# Patient Record
Sex: Female | Born: 1985 | Race: Black or African American | Hispanic: No | Marital: Single | Smoking: Never smoker
Health system: Southern US, Community
[De-identification: ages and names within clinical notes are randomized; demographics above are authoritative.]

## PROBLEM LIST (undated history)

## (undated) DIAGNOSIS — F419 Anxiety disorder, unspecified: Secondary | ICD-10-CM

## (undated) DIAGNOSIS — F329 Major depressive disorder, single episode, unspecified: Secondary | ICD-10-CM

## (undated) DIAGNOSIS — G43909 Migraine, unspecified, not intractable, without status migrainosus: Secondary | ICD-10-CM

## (undated) DIAGNOSIS — R102 Pelvic and perineal pain: Secondary | ICD-10-CM

## (undated) DIAGNOSIS — F32A Depression, unspecified: Secondary | ICD-10-CM

## (undated) DIAGNOSIS — K259 Gastric ulcer, unspecified as acute or chronic, without hemorrhage or perforation: Secondary | ICD-10-CM

## (undated) HISTORY — PX: CARDIAC ELECTROPHYSIOLOGY STUDY AND ABLATION: SHX1294

## (undated) HISTORY — PX: TONSILLECTOMY: SUR1361

---

## 2003-11-04 ENCOUNTER — Emergency Department: Payer: Self-pay | Admitting: Emergency Medicine

## 2003-12-06 ENCOUNTER — Inpatient Hospital Stay: Payer: Self-pay | Admitting: Internal Medicine

## 2004-02-04 ENCOUNTER — Emergency Department: Payer: Self-pay | Admitting: Emergency Medicine

## 2004-04-07 ENCOUNTER — Emergency Department: Payer: Self-pay | Admitting: Emergency Medicine

## 2007-01-25 HISTORY — PX: WISDOM TOOTH EXTRACTION: SHX21

## 2007-12-14 ENCOUNTER — Emergency Department: Payer: Self-pay | Admitting: Emergency Medicine

## 2008-05-11 ENCOUNTER — Emergency Department: Payer: Self-pay | Admitting: Emergency Medicine

## 2008-06-02 ENCOUNTER — Emergency Department: Payer: Self-pay | Admitting: Emergency Medicine

## 2008-09-28 ENCOUNTER — Emergency Department: Payer: Self-pay | Admitting: Emergency Medicine

## 2008-09-30 ENCOUNTER — Observation Stay: Payer: Self-pay | Admitting: Obstetrics & Gynecology

## 2008-11-24 ENCOUNTER — Observation Stay: Payer: Self-pay | Admitting: Obstetrics & Gynecology

## 2008-12-12 ENCOUNTER — Emergency Department: Payer: Self-pay | Admitting: Emergency Medicine

## 2008-12-30 ENCOUNTER — Inpatient Hospital Stay: Payer: Self-pay | Admitting: Obstetrics and Gynecology

## 2009-11-10 ENCOUNTER — Emergency Department: Payer: Self-pay | Admitting: Internal Medicine

## 2010-02-02 ENCOUNTER — Emergency Department: Payer: Self-pay | Admitting: Internal Medicine

## 2010-05-17 ENCOUNTER — Emergency Department: Payer: Self-pay | Admitting: Emergency Medicine

## 2010-07-02 ENCOUNTER — Observation Stay: Payer: Self-pay | Admitting: Internal Medicine

## 2010-11-17 ENCOUNTER — Emergency Department: Payer: Self-pay | Admitting: Emergency Medicine

## 2010-11-18 ENCOUNTER — Emergency Department: Payer: Self-pay | Admitting: Emergency Medicine

## 2011-01-12 ENCOUNTER — Emergency Department: Payer: Self-pay | Admitting: Internal Medicine

## 2011-01-22 ENCOUNTER — Ambulatory Visit: Payer: Self-pay | Admitting: Podiatry

## 2011-05-03 ENCOUNTER — Emergency Department: Payer: Self-pay | Admitting: *Deleted

## 2011-05-03 LAB — BASIC METABOLIC PANEL
Anion Gap: 9 (ref 7–16)
Calcium, Total: 8.6 mg/dL (ref 8.5–10.1)
Chloride: 107 mmol/L (ref 98–107)
EGFR (African American): >60
EGFR (Non-African Amer.): >60
Glucose: 71 mg/dL (ref 65–99)
Osmolality: 274 (ref 275–301)
Sodium: 139 mmol/L (ref 136–145)

## 2011-05-03 LAB — CBC
MCH: 27.8 pg (ref 26.0–34.0)
MCHC: 33.3 g/dL (ref 32.0–36.0)
Platelet: 222 10*3/uL (ref 150–440)
RBC: 4.25 10*6/uL (ref 3.80–5.20)
WBC: 7.8 10*3/uL (ref 3.6–11.0)

## 2011-05-03 LAB — URINALYSIS, COMPLETE
Blood: NEGATIVE
Ketone: NEGATIVE
Ph: 6 (ref 4.5–8.0)
Protein: NEGATIVE
RBC,UR: 2 /HPF (ref 0–5)
WBC UR: 2 /HPF (ref 0–5)

## 2011-05-03 LAB — PREGNANCY, URINE: Pregnancy Test, Urine: POSITIVE m[IU]/mL

## 2011-05-14 ENCOUNTER — Emergency Department: Payer: Self-pay | Admitting: Emergency Medicine

## 2011-05-14 LAB — HCG, QUANTITATIVE, PREGNANCY: Beta Hcg, Quant.: 142401 m[IU]/mL — ABNORMAL HIGH

## 2011-05-14 LAB — CBC
HGB: 10.9 g/dL — ABNORMAL LOW (ref 12.0–16.0)
MCV: 84 fL (ref 80–100)
Platelet: 244 10*3/uL (ref 150–440)
RDW: 13.6 % (ref 11.5–14.5)
WBC: 11.2 10*3/uL — ABNORMAL HIGH (ref 3.6–11.0)

## 2011-06-21 ENCOUNTER — Emergency Department: Payer: Self-pay | Admitting: Emergency Medicine

## 2011-06-21 LAB — URINALYSIS, COMPLETE
Glucose,UR: NEGATIVE mg/dL (ref 0–75)
Leukocyte Esterase: NEGATIVE
Nitrite: NEGATIVE
Ph: 7 (ref 4.5–8.0)
Protein: NEGATIVE
Specific Gravity: 1.021 (ref 1.003–1.030)
Squamous Epithelial: 10
WBC UR: 2 /HPF (ref 0–5)

## 2011-06-21 LAB — CBC
HCT: 32.8 % — ABNORMAL LOW (ref 35.0–47.0)
HGB: 10.8 g/dL — ABNORMAL LOW (ref 12.0–16.0)
MCH: 28 pg (ref 26.0–34.0)
MCHC: 33 g/dL (ref 32.0–36.0)
Platelet: 262 10*3/uL (ref 150–440)
RDW: 13.6 % (ref 11.5–14.5)

## 2011-06-21 LAB — BASIC METABOLIC PANEL
Calcium, Total: 8.7 mg/dL (ref 8.5–10.1)
Chloride: 105 mmol/L (ref 98–107)
Co2: 25 mmol/L (ref 21–32)
EGFR (Non-African Amer.): >60
Potassium: 3.6 mmol/L (ref 3.5–5.1)
Sodium: 138 mmol/L (ref 136–145)

## 2011-06-21 LAB — HEPATIC FUNCTION PANEL A (ARMC)
Albumin: 3.2 g/dL — ABNORMAL LOW (ref 3.4–5.0)
Total Protein: 7.3 g/dL (ref 6.4–8.2)

## 2011-07-23 ENCOUNTER — Emergency Department: Payer: Self-pay | Admitting: Internal Medicine

## 2011-07-23 LAB — URINALYSIS, COMPLETE
Bacteria: NONE SEEN
Blood: NEGATIVE
Nitrite: NEGATIVE
Protein: NEGATIVE
RBC,UR: <1 /HPF (ref 0–5)
Specific Gravity: 1.011 (ref 1.003–1.030)
Squamous Epithelial: 3
WBC UR: 1 /HPF (ref 0–5)

## 2011-09-20 ENCOUNTER — Observation Stay: Payer: Self-pay | Admitting: Obstetrics and Gynecology

## 2011-09-20 LAB — URINALYSIS, COMPLETE
Bilirubin,UR: NEGATIVE
Blood: NEGATIVE
Nitrite: NEGATIVE
Ph: 7 (ref 4.5–8.0)
Protein: NEGATIVE
Specific Gravity: 1.015 (ref 1.003–1.030)
WBC UR: 3 /HPF (ref 0–5)

## 2011-10-26 ENCOUNTER — Observation Stay: Payer: Self-pay

## 2011-10-26 LAB — URINALYSIS, COMPLETE
Bilirubin,UR: NEGATIVE
Glucose,UR: NEGATIVE mg/dL (ref 0–75)
Leukocyte Esterase: NEGATIVE
Nitrite: NEGATIVE
Protein: NEGATIVE
RBC,UR: NONE SEEN /HPF (ref 0–5)
Specific Gravity: 1.017 (ref 1.003–1.030)
WBC UR: 2 /HPF (ref 0–5)

## 2011-10-26 LAB — COMPREHENSIVE METABOLIC PANEL
Alkaline Phosphatase: 89 U/L (ref 50–136)
BUN: 4 mg/dL — ABNORMAL LOW (ref 7–18)
Bilirubin,Total: 0.5 mg/dL (ref 0.2–1.0)
Chloride: 105 mmol/L (ref 98–107)
Co2: 24 mmol/L (ref 21–32)
EGFR (Non-African Amer.): >60
Osmolality: 271 (ref 275–301)
SGOT(AST): 24 U/L (ref 15–37)

## 2011-10-26 LAB — CBC
HCT: 33 % — ABNORMAL LOW (ref 35.0–47.0)
HGB: 11.4 g/dL — ABNORMAL LOW (ref 12.0–16.0)
MCH: 29.1 pg (ref 26.0–34.0)
MCHC: 34.4 g/dL (ref 32.0–36.0)
MCV: 85 fL (ref 80–100)
RBC: 3.91 10*6/uL (ref 3.80–5.20)

## 2011-11-26 ENCOUNTER — Observation Stay: Payer: Self-pay | Admitting: Obstetrics & Gynecology

## 2012-02-14 ENCOUNTER — Emergency Department: Payer: Self-pay | Admitting: Emergency Medicine

## 2012-02-14 LAB — URINALYSIS, COMPLETE
Bacteria: NONE SEEN
Bilirubin,UR: NEGATIVE
Glucose,UR: NEGATIVE mg/dL (ref 0–75)
Ketone: NEGATIVE
Leukocyte Esterase: NEGATIVE
Nitrite: NEGATIVE
Ph: 6 (ref 4.5–8.0)
RBC,UR: 2 /HPF (ref 0–5)
Specific Gravity: 1.023 (ref 1.003–1.030)
WBC UR: 2 /HPF (ref 0–5)

## 2012-02-14 LAB — CBC
HGB: 12.5 g/dL (ref 12.0–16.0)
MCH: 27 pg (ref 26.0–34.0)
MCV: 84 fL (ref 80–100)
Platelet: 219 10*3/uL (ref 150–440)
RBC: 4.64 10*6/uL (ref 3.80–5.20)
RDW: 14.6 % — ABNORMAL HIGH (ref 11.5–14.5)

## 2012-02-14 LAB — BASIC METABOLIC PANEL
EGFR (African American): >60
Osmolality: 279 (ref 275–301)
Potassium: 3.9 mmol/L (ref 3.5–5.1)
Sodium: 140 mmol/L (ref 136–145)

## 2012-02-14 LAB — HEPATIC FUNCTION PANEL A (ARMC)
Bilirubin, Direct: 0.2 mg/dL (ref 0.00–0.20)
Bilirubin,Total: 0.8 mg/dL (ref 0.2–1.0)
SGOT(AST): 25 U/L (ref 15–37)

## 2012-05-01 ENCOUNTER — Emergency Department: Payer: Self-pay | Admitting: Emergency Medicine

## 2012-06-26 ENCOUNTER — Emergency Department: Payer: Self-pay | Admitting: Emergency Medicine

## 2012-06-26 LAB — COMPREHENSIVE METABOLIC PANEL
Albumin: 3.6 g/dL (ref 3.4–5.0)
Anion Gap: 3 — ABNORMAL LOW (ref 7–16)
BUN: 9 mg/dL (ref 7–18)
Co2: 29 mmol/L (ref 21–32)
Creatinine: 0.89 mg/dL (ref 0.60–1.30)
EGFR (African American): >60
EGFR (Non-African Amer.): >60
Glucose: 80 mg/dL (ref 65–99)
Osmolality: 275 (ref 275–301)
Sodium: 139 mmol/L (ref 136–145)

## 2012-06-26 LAB — LIPASE, BLOOD: Lipase: 148 U/L (ref 73–393)

## 2012-06-26 LAB — URINALYSIS, COMPLETE
Blood: NEGATIVE
Glucose,UR: NEGATIVE mg/dL (ref 0–75)
Ketone: NEGATIVE
Protein: NEGATIVE
Specific Gravity: 1.025 (ref 1.003–1.030)

## 2012-06-26 LAB — CBC
HCT: 37 % (ref 35.0–47.0)
HGB: 12.9 g/dL (ref 12.0–16.0)
MCH: 29.1 pg (ref 26.0–34.0)
MCHC: 35 g/dL (ref 32.0–36.0)
MCV: 83 fL (ref 80–100)
RBC: 4.45 10*6/uL (ref 3.80–5.20)
WBC: 6.7 10*3/uL (ref 3.6–11.0)

## 2012-06-26 LAB — GC/CHLAMYDIA PROBE AMP

## 2012-06-26 LAB — PREGNANCY, URINE: Pregnancy Test, Urine: NEGATIVE m[IU]/mL

## 2012-07-16 ENCOUNTER — Emergency Department: Payer: Self-pay | Admitting: Emergency Medicine

## 2012-07-16 LAB — COMPREHENSIVE METABOLIC PANEL
BUN: 9 mg/dL (ref 7–18)
Calcium, Total: 9 mg/dL (ref 8.5–10.1)
Chloride: 106 mmol/L (ref 98–107)
Co2: 29 mmol/L (ref 21–32)
EGFR (African American): >60
EGFR (Non-African Amer.): >60
Sodium: 140 mmol/L (ref 136–145)
Total Protein: 7.9 g/dL (ref 6.4–8.2)

## 2012-07-16 LAB — URINALYSIS, COMPLETE
Bilirubin,UR: NEGATIVE
Blood: NEGATIVE
Glucose,UR: NEGATIVE mg/dL (ref 0–75)
Leukocyte Esterase: NEGATIVE
Nitrite: NEGATIVE
Ph: 6 (ref 4.5–8.0)
Protein: NEGATIVE
RBC,UR: 2 /HPF (ref 0–5)
Squamous Epithelial: 3

## 2012-07-16 LAB — CBC
HGB: 13 g/dL (ref 12.0–16.0)
MCHC: 34.3 g/dL (ref 32.0–36.0)
RDW: 12.8 % (ref 11.5–14.5)

## 2012-08-22 ENCOUNTER — Emergency Department: Payer: Self-pay | Admitting: Emergency Medicine

## 2012-08-22 LAB — URINALYSIS, COMPLETE
Bilirubin,UR: NEGATIVE
Blood: NEGATIVE
Glucose,UR: NEGATIVE mg/dL (ref 0–75)
Ketone: NEGATIVE
Nitrite: NEGATIVE
Ph: 5 (ref 4.5–8.0)
RBC,UR: 2 /HPF (ref 0–5)
Specific Gravity: 1.025 (ref 1.003–1.030)
Squamous Epithelial: 8
WBC UR: 6 /HPF (ref 0–5)

## 2012-08-22 LAB — BASIC METABOLIC PANEL
Anion Gap: 3 — ABNORMAL LOW (ref 7–16)
BUN: 8 mg/dL (ref 7–18)
Chloride: 106 mmol/L (ref 98–107)
Co2: 29 mmol/L (ref 21–32)
EGFR (Non-African Amer.): >60
Glucose: 83 mg/dL (ref 65–99)
Osmolality: 273 (ref 275–301)
Potassium: 3.8 mmol/L (ref 3.5–5.1)
Sodium: 138 mmol/L (ref 136–145)

## 2012-08-22 LAB — CBC
HGB: 12.7 g/dL (ref 12.0–16.0)
MCH: 28.2 pg (ref 26.0–34.0)
RBC: 4.52 10*6/uL (ref 3.80–5.20)

## 2012-11-20 ENCOUNTER — Emergency Department: Payer: Self-pay | Admitting: Emergency Medicine

## 2012-11-20 LAB — CBC
HCT: 38.5 % (ref 35.0–47.0)
HGB: 13 g/dL (ref 12.0–16.0)
MCH: 28 pg (ref 26.0–34.0)
MCHC: 33.7 g/dL (ref 32.0–36.0)
Platelet: 248 10*3/uL (ref 150–440)
RBC: 4.64 10*6/uL (ref 3.80–5.20)
RDW: 13.6 % (ref 11.5–14.5)
WBC: 9.2 10*3/uL (ref 3.6–11.0)

## 2012-11-20 LAB — URINALYSIS, COMPLETE
Bilirubin,UR: NEGATIVE
Glucose,UR: NEGATIVE mg/dL (ref 0–75)
Ketone: NEGATIVE
Ph: 6 (ref 4.5–8.0)
Protein: NEGATIVE
RBC,UR: 2 /HPF (ref 0–5)
Squamous Epithelial: 3

## 2012-11-20 LAB — COMPREHENSIVE METABOLIC PANEL
Calcium, Total: 9 mg/dL (ref 8.5–10.1)
Chloride: 106 mmol/L (ref 98–107)
Creatinine: 0.86 mg/dL (ref 0.60–1.30)
EGFR (African American): >60
EGFR (Non-African Amer.): >60
Glucose: 104 mg/dL — ABNORMAL HIGH (ref 65–99)
Potassium: 3.5 mmol/L (ref 3.5–5.1)

## 2012-11-20 LAB — PREGNANCY, URINE: Pregnancy Test, Urine: NEGATIVE m[IU]/mL

## 2012-12-05 ENCOUNTER — Ambulatory Visit: Payer: Self-pay | Admitting: Unknown Physician Specialty

## 2012-12-13 ENCOUNTER — Emergency Department: Payer: Self-pay | Admitting: Emergency Medicine

## 2012-12-13 LAB — COMPREHENSIVE METABOLIC PANEL
Albumin: 3.6 g/dL (ref 3.4–5.0)
Alkaline Phosphatase: 55 U/L (ref 50–136)
BUN: 11 mg/dL (ref 7–18)
Calcium, Total: 8.8 mg/dL (ref 8.5–10.1)
Chloride: 107 mmol/L (ref 98–107)
Co2: 29 mmol/L (ref 21–32)
Creatinine: 0.96 mg/dL (ref 0.60–1.30)
EGFR (African American): >60
Glucose: 99 mg/dL (ref 65–99)
Potassium: 4.3 mmol/L (ref 3.5–5.1)

## 2012-12-13 LAB — URINALYSIS, COMPLETE
Bacteria: NONE SEEN
Bilirubin,UR: NEGATIVE
Blood: NEGATIVE
Glucose,UR: NEGATIVE mg/dL (ref 0–75)
Ketone: NEGATIVE
Leukocyte Esterase: NEGATIVE
Nitrite: NEGATIVE
Protein: NEGATIVE
RBC,UR: 1 /HPF (ref 0–5)
Squamous Epithelial: 2
WBC UR: <1 /HPF (ref 0–5)

## 2012-12-13 LAB — CBC WITH DIFFERENTIAL/PLATELET
Eosinophil #: 0.1 10*3/uL (ref 0.0–0.7)
Eosinophil %: 1.1 %
HCT: 35.8 % (ref 35.0–47.0)
MCH: 28 pg (ref 26.0–34.0)
MCHC: 33.7 g/dL (ref 32.0–36.0)
MCV: 83 fL (ref 80–100)
Monocyte #: 0.6 x10 3/mm (ref 0.2–0.9)
Monocyte %: 10.3 %
Neutrophil #: 2.9 10*3/uL (ref 1.4–6.5)
Neutrophil %: 48.5 %
Platelet: 211 10*3/uL (ref 150–440)

## 2012-12-28 ENCOUNTER — Ambulatory Visit: Payer: Self-pay | Admitting: Unknown Physician Specialty

## 2013-01-07 ENCOUNTER — Ambulatory Visit: Payer: Self-pay | Admitting: Unknown Physician Specialty

## 2013-01-10 ENCOUNTER — Emergency Department: Payer: Self-pay | Admitting: Emergency Medicine

## 2013-01-10 LAB — URINALYSIS, COMPLETE
Bilirubin,UR: NEGATIVE
Blood: NEGATIVE
Glucose,UR: NEGATIVE mg/dL (ref 0–75)
Ketone: NEGATIVE
Nitrite: NEGATIVE
Ph: 6 (ref 4.5–8.0)
Specific Gravity: 1.013 (ref 1.003–1.030)

## 2013-01-10 LAB — CBC WITH DIFFERENTIAL/PLATELET
Basophil #: 0 10*3/uL (ref 0.0–0.1)
Eosinophil %: 1 %
HCT: 37.7 % (ref 35.0–47.0)
Lymphocyte #: 1.7 10*3/uL (ref 1.0–3.6)
Lymphocyte %: 26.3 %
MCH: 27.9 pg (ref 26.0–34.0)
MCHC: 33.4 g/dL (ref 32.0–36.0)
MCV: 84 fL (ref 80–100)
Monocyte #: 0.6 x10 3/mm (ref 0.2–0.9)
Monocyte %: 8.9 %
Neutrophil #: 4.1 10*3/uL (ref 1.4–6.5)
Platelet: 215 10*3/uL (ref 150–440)
RBC: 4.51 10*6/uL (ref 3.80–5.20)
WBC: 6.5 10*3/uL (ref 3.6–11.0)

## 2013-01-10 LAB — BASIC METABOLIC PANEL
BUN: 10 mg/dL (ref 7–18)
Calcium, Total: 8.9 mg/dL (ref 8.5–10.1)
Creatinine: 0.84 mg/dL (ref 0.60–1.30)
EGFR (African American): >60
EGFR (Non-African Amer.): >60
Glucose: 87 mg/dL (ref 65–99)
Potassium: 3.7 mmol/L (ref 3.5–5.1)

## 2013-01-10 LAB — PREGNANCY, URINE: Pregnancy Test, Urine: NEGATIVE m[IU]/mL

## 2013-02-25 ENCOUNTER — Emergency Department: Payer: Self-pay | Admitting: Emergency Medicine

## 2013-02-25 LAB — CBC WITH DIFFERENTIAL/PLATELET
BASOS ABS: 0 10*3/uL (ref 0.0–0.1)
Basophil %: 0.3 %
EOS PCT: 1.2 %
Eosinophil #: 0.1 10*3/uL (ref 0.0–0.7)
HCT: 36.6 % (ref 35.0–47.0)
HGB: 12.3 g/dL (ref 12.0–16.0)
LYMPHS PCT: 36.1 %
Lymphocyte #: 2 10*3/uL (ref 1.0–3.6)
MCH: 28.1 pg (ref 26.0–34.0)
MCHC: 33.6 g/dL (ref 32.0–36.0)
MCV: 84 fL (ref 80–100)
MONO ABS: 0.5 x10 3/mm (ref 0.2–0.9)
Monocyte %: 9 %
Neutrophil #: 3 10*3/uL (ref 1.4–6.5)
Neutrophil %: 53.4 %
Platelet: 210 10*3/uL (ref 150–440)
RBC: 4.36 10*6/uL (ref 3.80–5.20)
RDW: 12.9 % (ref 11.5–14.5)
WBC: 5.6 10*3/uL (ref 3.6–11.0)

## 2013-02-25 LAB — COMPREHENSIVE METABOLIC PANEL
ALK PHOS: 57 U/L
ANION GAP: 4 — AB (ref 7–16)
AST: 21 U/L (ref 15–37)
Albumin: 3.4 g/dL (ref 3.4–5.0)
BILIRUBIN TOTAL: 0.6 mg/dL (ref 0.2–1.0)
BUN: 11 mg/dL (ref 7–18)
CO2: 26 mmol/L (ref 21–32)
Calcium, Total: 8.9 mg/dL (ref 8.5–10.1)
Chloride: 106 mmol/L (ref 98–107)
Creatinine: 0.89 mg/dL (ref 0.60–1.30)
EGFR (African American): >60
Glucose: 86 mg/dL (ref 65–99)
OSMOLALITY: 271 (ref 275–301)
Potassium: 3.5 mmol/L (ref 3.5–5.1)
SGPT (ALT): 15 U/L (ref 12–78)
Sodium: 136 mmol/L (ref 136–145)
Total Protein: 7.2 g/dL (ref 6.4–8.2)

## 2013-02-25 LAB — URINALYSIS, COMPLETE
BACTERIA: NONE SEEN
BLOOD: NEGATIVE
Bilirubin,UR: NEGATIVE
Glucose,UR: NEGATIVE mg/dL (ref 0–75)
Ketone: NEGATIVE
LEUKOCYTE ESTERASE: NEGATIVE
NITRITE: NEGATIVE
PH: 6 (ref 4.5–8.0)
Protein: NEGATIVE
RBC,UR: 1 /HPF (ref 0–5)
Specific Gravity: 1.023 (ref 1.003–1.030)

## 2013-02-25 LAB — LIPASE, BLOOD: Lipase: 153 U/L (ref 73–393)

## 2013-03-29 ENCOUNTER — Emergency Department: Payer: Self-pay | Admitting: Emergency Medicine

## 2013-03-29 LAB — URINALYSIS, COMPLETE
BLOOD: NEGATIVE
Bilirubin,UR: NEGATIVE
Glucose,UR: NEGATIVE mg/dL (ref 0–75)
KETONE: NEGATIVE
Leukocyte Esterase: NEGATIVE
NITRITE: NEGATIVE
PH: 6 (ref 4.5–8.0)
PROTEIN: NEGATIVE
RBC,UR: 1 /HPF (ref 0–5)
SPECIFIC GRAVITY: 1.019 (ref 1.003–1.030)
Squamous Epithelial: 3
Transitional Epi: <1
WBC UR: <1 /HPF (ref 0–5)

## 2013-03-29 LAB — COMPREHENSIVE METABOLIC PANEL
ALBUMIN: 3.5 g/dL (ref 3.4–5.0)
ALK PHOS: 54 U/L
Anion Gap: 5 — ABNORMAL LOW (ref 7–16)
BUN: 5 mg/dL — ABNORMAL LOW (ref 7–18)
Bilirubin,Total: 0.5 mg/dL (ref 0.2–1.0)
CHLORIDE: 104 mmol/L (ref 98–107)
Calcium, Total: 8.3 mg/dL — ABNORMAL LOW (ref 8.5–10.1)
Co2: 26 mmol/L (ref 21–32)
Creatinine: 0.76 mg/dL (ref 0.60–1.30)
EGFR (African American): >60
EGFR (Non-African Amer.): >60
Glucose: 81 mg/dL (ref 65–99)
Osmolality: 266 (ref 275–301)
Potassium: 3.4 mmol/L — ABNORMAL LOW (ref 3.5–5.1)
SGOT(AST): 16 U/L (ref 15–37)
SGPT (ALT): 12 U/L (ref 12–78)
Sodium: 135 mmol/L — ABNORMAL LOW (ref 136–145)
Total Protein: 7.5 g/dL (ref 6.4–8.2)

## 2013-03-29 LAB — TROPONIN I: Troponin-I: <0.02 ng/mL

## 2013-03-29 LAB — CBC
HCT: 36.5 % (ref 35.0–47.0)
HGB: 12.3 g/dL (ref 12.0–16.0)
MCH: 28.5 pg (ref 26.0–34.0)
MCHC: 33.8 g/dL (ref 32.0–36.0)
MCV: 84 fL (ref 80–100)
PLATELETS: 240 10*3/uL (ref 150–440)
RBC: 4.33 10*6/uL (ref 3.80–5.20)
RDW: 13.7 % (ref 11.5–14.5)
WBC: 9.3 10*3/uL (ref 3.6–11.0)

## 2013-05-20 ENCOUNTER — Emergency Department: Payer: Self-pay | Admitting: Emergency Medicine

## 2013-05-20 LAB — URINALYSIS, COMPLETE
BACTERIA: NONE SEEN
BILIRUBIN, UR: NEGATIVE
BLOOD: NEGATIVE
Glucose,UR: NEGATIVE mg/dL (ref 0–75)
LEUKOCYTE ESTERASE: NEGATIVE
Nitrite: NEGATIVE
Ph: 6 (ref 4.5–8.0)
Protein: NEGATIVE
RBC,UR: NONE SEEN /HPF (ref 0–5)
Specific Gravity: 1.021 (ref 1.003–1.030)
WBC UR: NONE SEEN /HPF (ref 0–5)

## 2013-05-20 LAB — COMPREHENSIVE METABOLIC PANEL
ALBUMIN: 3.2 g/dL — AB (ref 3.4–5.0)
ALK PHOS: 54 U/L
ALT: 15 U/L (ref 12–78)
ANION GAP: 9 (ref 7–16)
AST: 15 U/L (ref 15–37)
BUN: 5 mg/dL — AB (ref 7–18)
Bilirubin,Total: 0.3 mg/dL (ref 0.2–1.0)
CALCIUM: 8.8 mg/dL (ref 8.5–10.1)
CO2: 23 mmol/L (ref 21–32)
Chloride: 104 mmol/L (ref 98–107)
Creatinine: 0.72 mg/dL (ref 0.60–1.30)
GLUCOSE: 91 mg/dL (ref 65–99)
OSMOLALITY: 269 (ref 275–301)
POTASSIUM: 3.3 mmol/L — AB (ref 3.5–5.1)
Sodium: 136 mmol/L (ref 136–145)
TOTAL PROTEIN: 7.4 g/dL (ref 6.4–8.2)

## 2013-05-20 LAB — CBC WITH DIFFERENTIAL/PLATELET
BASOS ABS: 0 10*3/uL (ref 0.0–0.1)
BASOS PCT: 0.1 %
Eosinophil #: 0.1 10*3/uL (ref 0.0–0.7)
Eosinophil %: 1.3 %
HCT: 33.9 % — AB (ref 35.0–47.0)
HGB: 11.3 g/dL — ABNORMAL LOW (ref 12.0–16.0)
Lymphocyte #: 2.1 10*3/uL (ref 1.0–3.6)
Lymphocyte %: 26 %
MCH: 28.3 pg (ref 26.0–34.0)
MCHC: 33.3 g/dL (ref 32.0–36.0)
MCV: 85 fL (ref 80–100)
MONO ABS: 0.5 x10 3/mm (ref 0.2–0.9)
MONOS PCT: 6.1 %
Neutrophil #: 5.3 10*3/uL (ref 1.4–6.5)
Neutrophil %: 66.5 %
Platelet: 209 10*3/uL (ref 150–440)
RBC: 3.99 10*6/uL (ref 3.80–5.20)
RDW: 14.3 % (ref 11.5–14.5)
WBC: 8 10*3/uL (ref 3.6–11.0)

## 2013-05-20 LAB — HCG, QUANTITATIVE, PREGNANCY: Beta Hcg, Quant.: 7131 m[IU]/mL — ABNORMAL HIGH

## 2013-05-22 ENCOUNTER — Emergency Department: Payer: Self-pay | Admitting: Emergency Medicine

## 2013-05-22 LAB — COMPREHENSIVE METABOLIC PANEL
ALK PHOS: 48 U/L
Albumin: 3.1 g/dL — ABNORMAL LOW (ref 3.4–5.0)
Anion Gap: 8 (ref 7–16)
BUN: 6 mg/dL — ABNORMAL LOW (ref 7–18)
Bilirubin,Total: 0.3 mg/dL (ref 0.2–1.0)
CHLORIDE: 104 mmol/L (ref 98–107)
CO2: 26 mmol/L (ref 21–32)
CREATININE: 0.63 mg/dL (ref 0.60–1.30)
Calcium, Total: 9.3 mg/dL (ref 8.5–10.1)
EGFR (African American): >60
Glucose: 85 mg/dL (ref 65–99)
Osmolality: 273 (ref 275–301)
POTASSIUM: 3.8 mmol/L (ref 3.5–5.1)
SGOT(AST): 15 U/L (ref 15–37)
SGPT (ALT): 17 U/L (ref 12–78)
Sodium: 138 mmol/L (ref 136–145)
Total Protein: 7.3 g/dL (ref 6.4–8.2)

## 2013-05-22 LAB — URINALYSIS, COMPLETE
BACTERIA: NONE SEEN
BILIRUBIN, UR: NEGATIVE
BLOOD: NEGATIVE
Glucose,UR: NEGATIVE mg/dL (ref 0–75)
Ketone: NEGATIVE
Leukocyte Esterase: NEGATIVE
Nitrite: NEGATIVE
PH: 7 (ref 4.5–8.0)
Protein: NEGATIVE
RBC,UR: NONE SEEN /HPF (ref 0–5)
SQUAMOUS EPITHELIAL: NONE SEEN
Specific Gravity: 1.018 (ref 1.003–1.030)
WBC UR: NONE SEEN /HPF (ref 0–5)

## 2013-05-22 LAB — CBC
HCT: 34.2 % — AB (ref 35.0–47.0)
HGB: 11.1 g/dL — AB (ref 12.0–16.0)
MCH: 27.5 pg (ref 26.0–34.0)
MCHC: 32.4 g/dL (ref 32.0–36.0)
MCV: 85 fL (ref 80–100)
Platelet: 227 10*3/uL (ref 150–440)
RBC: 4.04 10*6/uL (ref 3.80–5.20)
RDW: 14.1 % (ref 11.5–14.5)
WBC: 9.2 10*3/uL (ref 3.6–11.0)

## 2013-05-22 LAB — MAGNESIUM: MAGNESIUM: 1.9 mg/dL

## 2013-05-22 LAB — HCG, QUANTITATIVE, PREGNANCY: Beta Hcg, Quant.: 6088 m[IU]/mL — ABNORMAL HIGH

## 2013-07-08 ENCOUNTER — Observation Stay: Payer: Self-pay

## 2013-07-08 LAB — URINALYSIS, COMPLETE
BILIRUBIN, UR: NEGATIVE
Bacteria: NONE SEEN
Blood: NEGATIVE
Glucose,UR: NEGATIVE mg/dL (ref 0–75)
Ketone: NEGATIVE
Leukocyte Esterase: NEGATIVE
NITRITE: NEGATIVE
Ph: 7 (ref 4.5–8.0)
Protein: NEGATIVE
RBC,UR: 1 /HPF (ref 0–5)
Specific Gravity: 1.02 (ref 1.003–1.030)
WBC UR: NONE SEEN /HPF (ref 0–5)

## 2013-08-07 ENCOUNTER — Emergency Department: Payer: Self-pay | Admitting: Emergency Medicine

## 2013-08-07 LAB — CBC
HCT: 32.7 % — AB (ref 35.0–47.0)
HGB: 10.7 g/dL — AB (ref 12.0–16.0)
MCH: 28.1 pg (ref 26.0–34.0)
MCHC: 32.8 g/dL (ref 32.0–36.0)
MCV: 86 fL (ref 80–100)
Platelet: 197 10*3/uL (ref 150–440)
RBC: 3.83 10*6/uL (ref 3.80–5.20)
RDW: 14.3 % (ref 11.5–14.5)
WBC: 8.2 10*3/uL (ref 3.6–11.0)

## 2013-08-07 LAB — COMPREHENSIVE METABOLIC PANEL
ALK PHOS: 54 U/L
AST: 21 U/L (ref 15–37)
Albumin: 2.7 g/dL — ABNORMAL LOW (ref 3.4–5.0)
Anion Gap: 7 (ref 7–16)
BUN: 5 mg/dL — AB (ref 7–18)
Bilirubin,Total: 0.3 mg/dL (ref 0.2–1.0)
CHLORIDE: 108 mmol/L — AB (ref 98–107)
Calcium, Total: 8.3 mg/dL — ABNORMAL LOW (ref 8.5–10.1)
Co2: 24 mmol/L (ref 21–32)
Creatinine: 0.69 mg/dL (ref 0.60–1.30)
EGFR (Non-African Amer.): >60
Glucose: 82 mg/dL (ref 65–99)
OSMOLALITY: 274 (ref 275–301)
Potassium: 3.4 mmol/L — ABNORMAL LOW (ref 3.5–5.1)
SGPT (ALT): 16 U/L (ref 12–78)
Sodium: 139 mmol/L (ref 136–145)
Total Protein: 6.8 g/dL (ref 6.4–8.2)

## 2013-08-07 LAB — LIPASE, BLOOD: LIPASE: 150 U/L (ref 73–393)

## 2013-09-17 ENCOUNTER — Observation Stay: Payer: Self-pay | Admitting: Obstetrics and Gynecology

## 2013-09-17 LAB — FETAL FIBRONECTIN
Appearance: NORMAL
Fetal Fibronectin: NEGATIVE

## 2013-09-17 LAB — URINALYSIS, COMPLETE
Bilirubin,UR: NEGATIVE
Blood: NEGATIVE
Glucose,UR: NEGATIVE mg/dL (ref 0–75)
Ketone: NEGATIVE
Leukocyte Esterase: NEGATIVE
Nitrite: NEGATIVE
PH: 7 (ref 4.5–8.0)
Protein: NEGATIVE
RBC,UR: 1 /HPF (ref 0–5)
SPECIFIC GRAVITY: 1.018 (ref 1.003–1.030)
Squamous Epithelial: 5
WBC UR: 1 /HPF (ref 0–5)

## 2013-10-03 ENCOUNTER — Observation Stay: Payer: Self-pay | Admitting: Obstetrics & Gynecology

## 2013-10-03 LAB — URINALYSIS, COMPLETE
Bilirubin,UR: NEGATIVE
Blood: NEGATIVE
Glucose,UR: NEGATIVE mg/dL (ref 0–75)
KETONE: NEGATIVE
Leukocyte Esterase: NEGATIVE
NITRITE: NEGATIVE
Ph: 7 (ref 4.5–8.0)
Protein: NEGATIVE
Specific Gravity: 1.026 (ref 1.003–1.030)
Squamous Epithelial: 2
WBC UR: 1 /HPF (ref 0–5)

## 2014-05-06 ENCOUNTER — Emergency Department: Admit: 2014-05-06 | Disposition: A | Discharge status: Home / Self Care | Payer: Self-pay | Admitting: Student

## 2014-05-06 LAB — URINALYSIS, COMPLETE
BLOOD: NEGATIVE
Bacteria: NONE SEEN
Bilirubin,UR: NEGATIVE
Glucose,UR: NEGATIVE mg/dL (ref 0–75)
Ketone: NEGATIVE
Leukocyte Esterase: NEGATIVE
Nitrite: NEGATIVE
PROTEIN: NEGATIVE
Ph: 5 (ref 4.5–8.0)
SPECIFIC GRAVITY: 1.019 (ref 1.003–1.030)

## 2014-05-06 LAB — CBC
HCT: 38.5 % (ref 35.0–47.0)
HGB: 12.5 g/dL (ref 12.0–16.0)
MCH: 27.7 pg (ref 26.0–34.0)
MCHC: 32.5 g/dL (ref 32.0–36.0)
MCV: 85 fL (ref 80–100)
PLATELETS: 229 10*3/uL (ref 150–440)
RBC: 4.51 10*6/uL (ref 3.80–5.20)
RDW: 13 % (ref 11.5–14.5)
WBC: 5.7 10*3/uL (ref 3.6–11.0)

## 2014-05-06 LAB — COMPREHENSIVE METABOLIC PANEL
ALBUMIN: 3.9 g/dL
ALT: 11 U/L — AB
Alkaline Phosphatase: 49 U/L
Anion Gap: 4 — ABNORMAL LOW (ref 7–16)
BUN: 12 mg/dL
Bilirubin,Total: 0.5 mg/dL
CALCIUM: 8.7 mg/dL — AB
CO2: 27 mmol/L
CREATININE: 0.9 mg/dL
Chloride: 108 mmol/L
EGFR (Non-African Amer.): >60
GLUCOSE: 87 mg/dL
POTASSIUM: 3.6 mmol/L
SGOT(AST): 18 U/L
SODIUM: 139 mmol/L
TOTAL PROTEIN: 7.3 g/dL

## 2014-05-06 LAB — PREGNANCY, URINE: PREGNANCY TEST, URINE: NEGATIVE m[IU]/mL

## 2014-05-06 LAB — LIPASE, BLOOD: Lipase: 36 U/L

## 2014-06-03 NOTE — H&P (Signed)
L&D Evaluation:  History Expanded:  HPI 29 yo G5P3013 presents to L&D at approx 32 weeks 5 days from ER for lower abdominal pain and oain in her pelvis. SHe had a fall a few weeks ago, but has not felt good since then with the pain getting worse with standing and when she gets out of bed.Her BP today is 89/51 and she does not seem to be in distress, SHe is having trouble sleeping due to the pain.  She denies VB, she reports good FM. Does feel occas ctx, but pt describes them as cramping. She saw [facility] MW her in L and D back in June 2015 but no H and P done so no record of what complaints or plan was. [facility]. Pt has had 3 term deliveries in the past. She states she has not been able to get out of bed or get to work for two days due to the pain she is having. It is in her lower pelvis. Dizziness and anemia was a problem from the beginning of her pregnancy and she was told to take iron along with her PNV.   Gravida 5   Term 3   PreTerm 0   Abortion 1   Living 3   Group B Strep Results Maternal (Result >5wks must be treated as unknown) negative   Maternal HIV Positive   EDC 08-Nov-2013   Presents with abdominal pain, back pain   Patient's Medical History No Chronic Illness   Patient's Surgical History none   Medications Pre Natal Vitamins  Iron   Allergies PCN, amoxicillin, cephalosporins   Social History none   Family History Non-Contributory   ROS:  ROS see HPI   Exam:  Vital Signs stable  89/51   Urine Protein not completed   General no apparent distress   Mental Status clear   Chest clear   Heart normal sinus rhythm   Abdomen gravid, non-tender   Estimated Fetal Weight Average for gestational age   Back no CVAT   Edema no edema   Pelvic no external lesions, ft/thick/high   Mebranes Intact   FHT normal rate with no decels, very active fetus,Reassuring. unable to  trace due to belly size   Ucx absent   Skin  dry   Impression:  Impression reactive NST, 32w 5 days   Plan:  Plan EFM/NST, monitor contractions and for cervical change, discharge   Comments UA done, FFN done get belly binder and give flexeril  discharge to home and [facility]. discussed exercises for her to do to strengthen back [facility] to give her PT consult and to follow up with BP. Pt to use benadryl to sleep if needed prn only and also to use sparingly use tylenol sparingly for back oaon no more thatn 2000 mg a day   Follow Up Appointment already scheduled   Electronic Signatures: Adria DevonKlett, Katoya Amato (MD)  (Signed 25-Aug-15 15:23)  Authored: L&D Evaluation   Last Updated: 25-Aug-15 15:23 by Adria DevonKlett, Tawfiq Favila (MD)

## 2014-06-03 NOTE — H&P (Signed)
L&D Evaluation:  History Expanded:  HPI 29 yo [id number] presents to L&D at approx 35weeks for lower abdominal pain. LOF as well x 24 hours, scant and intermittant.  She denies VB, she reports good FM. Does feel occas ctx, but pt describes them as cramping.  [facility] so [facility]. Pt has had 3 term deliveries in the past.  Dizziness and anemia was a problem from the beginning of her pregnancy and she was told to take iron along with her PNV.   Gravida 5   Term 3   PreTerm 0   Abortion 1   Living 3   Group B Strep Results Maternal (Result >5wks must be treated as unknown) negative   Maternal HIV Positive   EDC 08-Nov-2013   Presents with abdominal pain, back pain   Patient's Medical History No Chronic Illness   Patient's Surgical History none   Medications Pre Natal Vitamins  Iron   Allergies PCN, amoxicillin, cephalosporins   Social History none   Family History Non-Contributory   ROS:  ROS see HPI   Exam:  Vital Signs stable   Urine Protein not completed   General no apparent distress   Mental Status clear   Abdomen gravid, non-tender   Estimated Fetal Weight Average for gestational age   Back no CVAT   Edema no edema   Pelvic no external lesions, cervix closed and thick   Mebranes Intact, Neg Nitrazine and pool   FHT normal rate with no decels, very active fetus,Reassuring   Ucx absent   Skin dry   Impression:  Impression reactive NST, 35 weeks.  Abd pain.   Plan:  Plan EFM/NST, monitor contractions and for cervical change, discharge   Comments UA done, neg discharge to home and [facility].  fluids and rest   Follow Up Appointment already scheduled   Electronic Signatures: Letitia LibraHarris, Robert Paul (MD)  (Signed 10-Sep-15 20:42)  Authored: L&D Evaluation   Last Updated: 10-Sep-15 20:42 by Letitia LibraHarris, Robert Paul (MD)

## 2014-06-03 NOTE — H&P (Signed)
L&D Evaluation:  History:   HPI 29 yo G4P2012 at approximately [redacted] weeks gestation presenting with mutliple complaints.    The patient states she was at work started feeling dizzy and seeing lines in her vision.  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  She was sitting at the onset of symptoms.  Denies similar symptoms previously in the pregnancy.  She does feel like her heart was razing at the time of the dizzyness.  Resolved with laying down.  She also reports abdominal pain for the last three weeks.  She has a sharp intermitten pain sides of pelvis radiating into groin, this pain is positional and worse when standing.  Seperate with this she also reports epigastric pain brought on after eating.  She does report morning cough, occasional sour taste in the back of her throat.  Has not taken any OTC antacids although she does report being diagnosed with indigestion earlier this pregnancy.  No constipation, no diarrhea, occassional nausea  Reports +FM, no LOF, no VB.  [facility].  PNL A+, ABSC neg, RI, HIV neg, HBsAg neg, GC/CT neg/neg    Presents with abdominal pain, dizzyness    Patient's Medical History anxiety    Patient's Surgical History none    Medications Pre Natal Vitamins    Allergies PCN, amoxicillin, cephalosporin   ROS:   ROS see above otherwise negative   Exam:   Vital Signs stable    General no apparent distress    Mental Status clear    Heart normal sinus rhythm    Abdomen gravid, non-tender    Estimated Fetal Weight Average for gestational age    Back no CVAT    Edema no edema    Reflexes 1+    FHT normal rate with no decels, reactive NST    Ucx absent   Impression:   Impression Discomforts of pregnancy   Plan:   Plan EFM/NST    Comments 1) Dizzyness - discussed vasovagal syncope/caval compression during pregnancy.    2) Abdominal pain - seems  secondary to round ligament pain based on description as well as GERD symptoms      - Rx famotidine 20mg  tab po bid  3) Disposition - discharge home with [facility]    Follow Up Appointment already scheduled   Electronic Signatures: Lorrene ReidStaebler, Karron Goens M (MD)  (Signed 27-Aug-13 16:21)  Authored: L&D Evaluation   Last Updated: 27-Aug-13 16:21 by Lorrene ReidStaebler, Addy Mcmannis M (MD)

## 2014-06-03 NOTE — H&P (Signed)
L&D Evaluation:  History:   HPI 29 yo [id number] presents to L&D at approx 33 weeks from ER after a fall she had at home. She was checked out in the ER and cleared. Pt c/o some leaking today, especially after fall. Pt states she was going up steps and got a little dizzy and fell down on her left side. She denies VB, she reports good FM. Does feel occas ctx, but pt describes them as cramping. States she was in ER last week to get hydrated because she couldn't keep anything down.  [facility]. Pt has had 2 term deliveries in the past. Dizziness and anemia was a problem from the beginning of her pregnancy and she was told to take iron along with her PNV.    Presents with leaking fluid, fall    Patient's Medical History No Chronic Illness    Patient's Surgical History none    Medications Pre Natal Vitamins  Iron    Allergies PCN, amoxicillin, cephalosporins    Social History none    Family History Non-Contributory   ROS:   ROS see HPI   Exam:   Vital Signs stable    Urine Protein not completed    General no apparent distress    Mental Status clear    Chest clear    Abdomen gravid, non-tender    Estimated Fetal Weight Average for gestational age    Back no CVAT    Edema no edema    Pelvic no external lesions, ft/thick/high    Mebranes Intact, nitrazine neg, no fern, no pooling    FHT normal rate with no decels    Ucx irritability    Skin dry   Impression:   Impression reactive NST, 31 weeks, fall   Plan:   Plan EFM/NST, monitor contractions and for cervical change, discharge, fern neg, nitrazine neg, wet prep neg    Comments UA done in ER neg except for ketones CBC and CMP WNL, EKG in ER normal discharge to home and [facility].   Electronic Signatures: Shella Maximutnam, Sheily Lineman (CNM)  (Signed 02-Oct-13 21:27)  Authored: L&D Evaluation   Last Updated: 02-Oct-13 21:27 by Shella MaximPutnam, Sharday Michl (CNM)

## 2014-07-20 ENCOUNTER — Emergency Department: Payer: 59

## 2014-07-20 ENCOUNTER — Emergency Department
Admission: EM | Admit: 2014-07-20 | Discharge: 2014-07-20 | Disposition: A | Discharge status: Home / Self Care | Payer: 59 | Attending: Emergency Medicine | Admitting: Emergency Medicine

## 2014-07-20 ENCOUNTER — Encounter: Payer: Self-pay | Admitting: Medical Oncology

## 2014-07-20 ENCOUNTER — Other Ambulatory Visit: Payer: Self-pay

## 2014-07-20 DIAGNOSIS — K2970 Gastritis, unspecified, without bleeding: Secondary | ICD-10-CM | POA: Diagnosis not present

## 2014-07-20 DIAGNOSIS — Z880 Allergy status to penicillin: Secondary | ICD-10-CM | POA: Insufficient documentation

## 2014-07-20 DIAGNOSIS — Z79899 Other long term (current) drug therapy: Secondary | ICD-10-CM | POA: Insufficient documentation

## 2014-07-20 DIAGNOSIS — Z3202 Encounter for pregnancy test, result negative: Secondary | ICD-10-CM | POA: Insufficient documentation

## 2014-07-20 DIAGNOSIS — R109 Unspecified abdominal pain: Secondary | ICD-10-CM

## 2014-07-20 DIAGNOSIS — R1013 Epigastric pain: Secondary | ICD-10-CM | POA: Diagnosis present

## 2014-07-20 HISTORY — DX: Major depressive disorder, single episode, unspecified: F32.9

## 2014-07-20 HISTORY — DX: Anxiety disorder, unspecified: F41.9

## 2014-07-20 HISTORY — DX: Gastric ulcer, unspecified as acute or chronic, without hemorrhage or perforation: K25.9

## 2014-07-20 HISTORY — DX: Depression, unspecified: F32.A

## 2014-07-20 LAB — CBC WITH DIFFERENTIAL/PLATELET
BASOS PCT: 0 %
Basophils Absolute: 0 10*3/uL (ref 0–0.1)
Eosinophils Absolute: 0.1 10*3/uL (ref 0–0.7)
Eosinophils Relative: 1 %
HEMATOCRIT: 39.6 % (ref 35.0–47.0)
HEMOGLOBIN: 12.9 g/dL (ref 12.0–16.0)
LYMPHS ABS: 2.2 10*3/uL (ref 1.0–3.6)
LYMPHS PCT: 34 %
MCH: 27.6 pg (ref 26.0–34.0)
MCHC: 32.6 g/dL (ref 32.0–36.0)
MCV: 84.5 fL (ref 80.0–100.0)
MONO ABS: 0.6 10*3/uL (ref 0.2–0.9)
MONOS PCT: 9 %
Neutro Abs: 3.6 10*3/uL (ref 1.4–6.5)
Neutrophils Relative %: 56 %
Platelets: 244 10*3/uL (ref 150–440)
RBC: 4.69 MIL/uL (ref 3.80–5.20)
RDW: 12.7 % (ref 11.5–14.5)
WBC: 6.4 10*3/uL (ref 3.6–11.0)

## 2014-07-20 LAB — COMPREHENSIVE METABOLIC PANEL
ALK PHOS: 43 U/L (ref 38–126)
ALT: 15 U/L (ref 14–54)
AST: 20 U/L (ref 15–41)
Albumin: 4.1 g/dL (ref 3.5–5.0)
Anion gap: 9 (ref 5–15)
BUN: 11 mg/dL (ref 6–20)
CHLORIDE: 105 mmol/L (ref 101–111)
CO2: 25 mmol/L (ref 22–32)
Calcium: 9.4 mg/dL (ref 8.9–10.3)
Creatinine, Ser: 0.87 mg/dL (ref 0.44–1.00)
GFR calc Af Amer: >60 mL/min (ref >60)
Glucose, Bld: 94 mg/dL (ref 65–99)
POTASSIUM: 3.8 mmol/L (ref 3.5–5.1)
Sodium: 139 mmol/L (ref 135–145)
TOTAL PROTEIN: 7.9 g/dL (ref 6.5–8.1)
Total Bilirubin: 0.6 mg/dL (ref 0.3–1.2)

## 2014-07-20 LAB — URINALYSIS COMPLETE WITH MICROSCOPIC (ARMC ONLY)
Bacteria, UA: NONE SEEN
Bilirubin Urine: NEGATIVE
Glucose, UA: NEGATIVE mg/dL
HGB URINE DIPSTICK: NEGATIVE
Ketones, ur: NEGATIVE mg/dL
Leukocytes, UA: NEGATIVE
NITRITE: NEGATIVE
PROTEIN: NEGATIVE mg/dL
RBC / HPF: NONE SEEN RBC/hpf (ref 0–5)
Specific Gravity, Urine: 1.021 (ref 1.005–1.030)
pH: 6 (ref 5.0–8.0)

## 2014-07-20 LAB — LIPASE, BLOOD: LIPASE: 49 U/L (ref 22–51)

## 2014-07-20 LAB — PREGNANCY, URINE: Preg Test, Ur: NEGATIVE

## 2014-07-20 IMAGING — CR DG CHEST 2V
2 series · 2 of 2 positions shown · non-contrast
Comparison: [DATE]

CLINICAL DATA: Abdominal pain earlier this week.  Nonsmoker.

EXAM:
CHEST  2 VIEW

[chest pa]
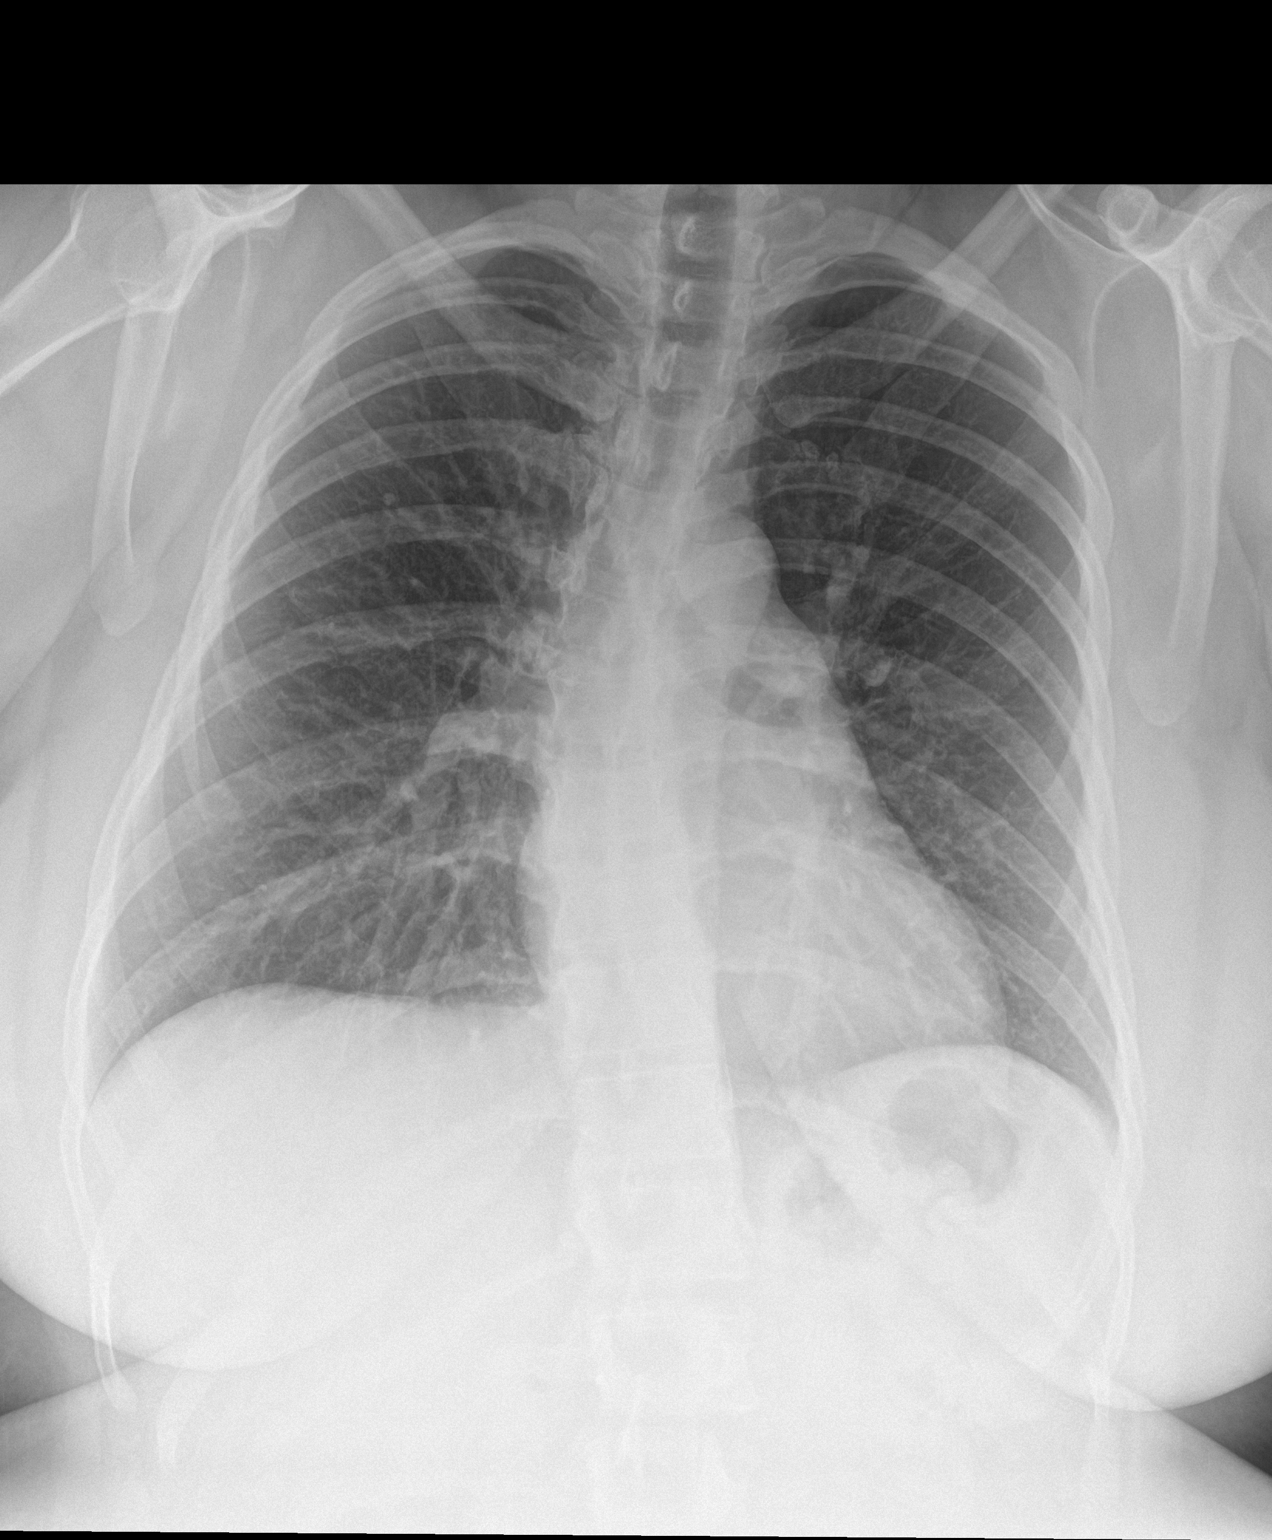

[chest lat]
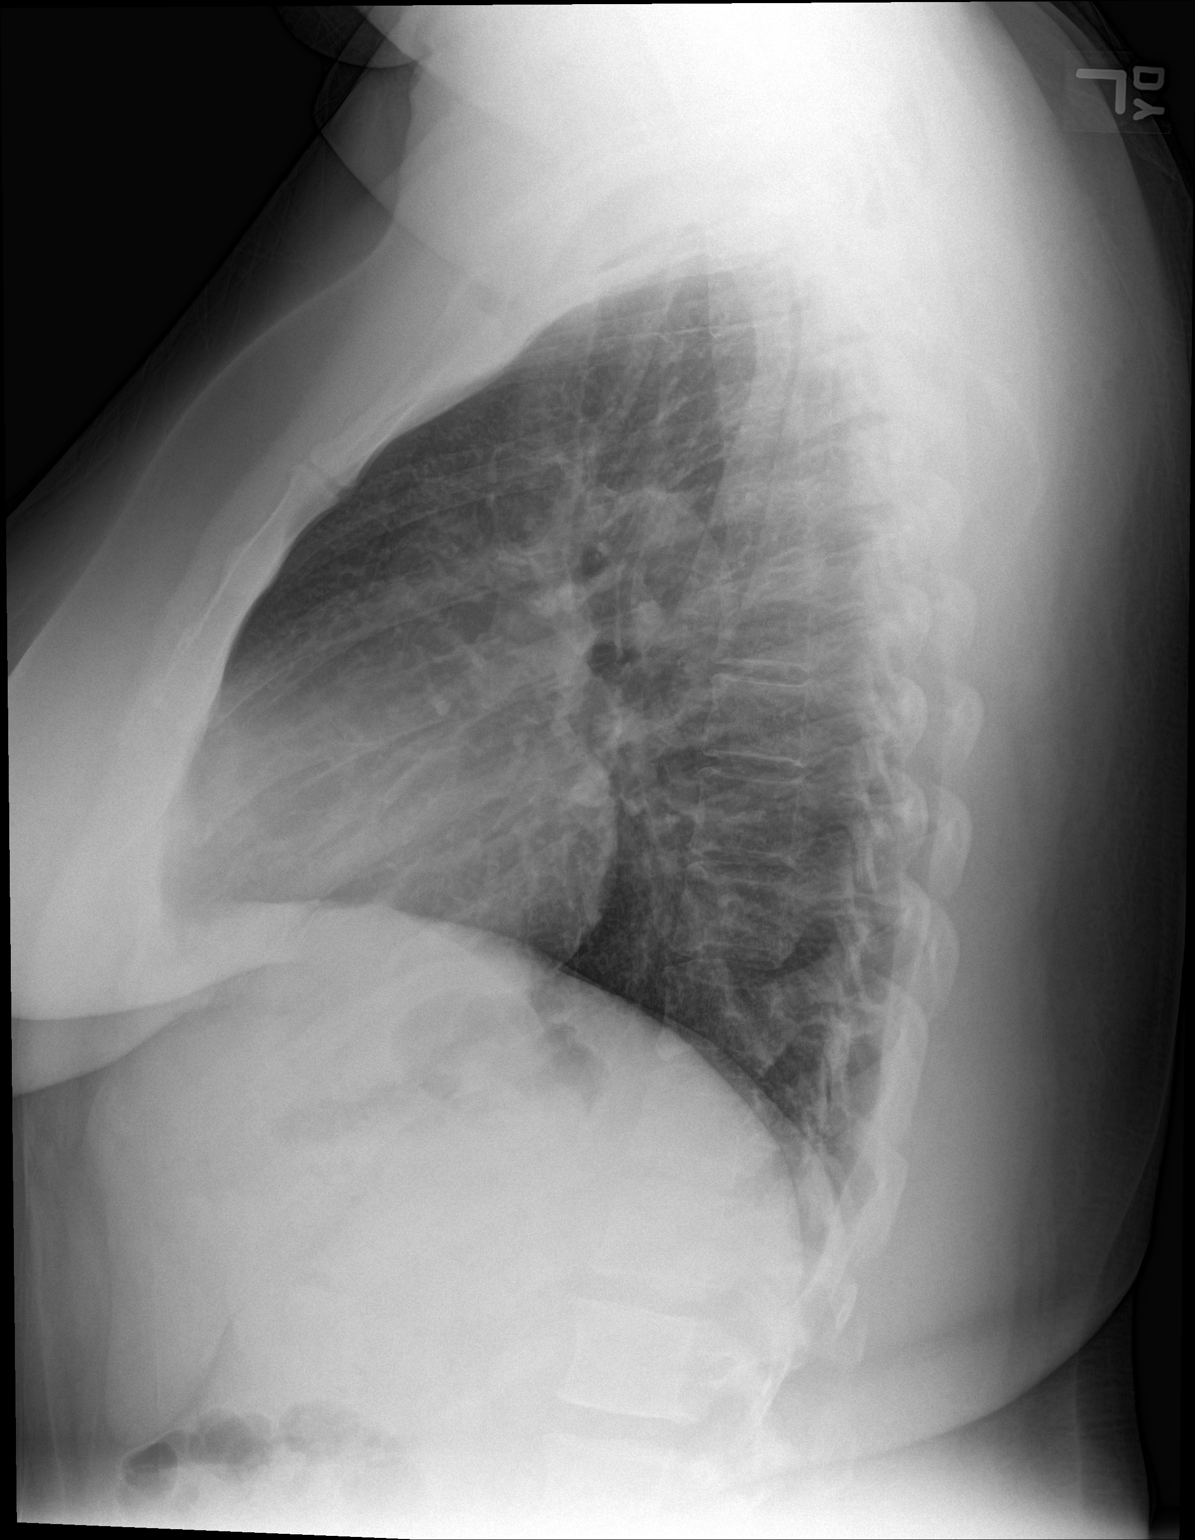

[2 of 2 positions shown; findings below may reference images not displayed]

FINDINGS: Lungs are adequately inflated and otherwise clear. Cardiomediastinal
silhouette is within normal. There is minimal curvature of the
thoracic spine convex to the right unchanged. Minimal spondylosis of
the thoracic spine.
IMPRESSION: No active cardiopulmonary disease.

## 2014-07-20 MED ORDER — PANTOPRAZOLE SODIUM 40 MG PO TBEC
DELAYED_RELEASE_TABLET | ORAL | Status: AC
  Filled 2014-07-20: qty 1

## 2014-07-20 MED ORDER — ONDANSETRON HCL 4 MG PO TABS: 4.0000 mg | ORAL_TABLET | Freq: Every day | ORAL | Status: DC | PRN

## 2014-07-20 MED ORDER — GI COCKTAIL ~~LOC~~
ORAL | Status: AC
  Administered 2014-07-20: 30 mL via ORAL
  Filled 2014-07-20: qty 30

## 2014-07-20 MED ORDER — PANTOPRAZOLE SODIUM 40 MG PO TBEC
40.0000 mg | DELAYED_RELEASE_TABLET | Freq: Once | ORAL | Status: AC
  Administered 2014-07-20: 40 mg via ORAL

## 2014-07-20 MED ORDER — ONDANSETRON HCL 4 MG/2ML IJ SOLN
4.0000 mg | Freq: Once | INTRAMUSCULAR | Status: AC
  Administered 2014-07-20: 4 mg via INTRAVENOUS

## 2014-07-20 MED ORDER — SODIUM CHLORIDE 0.9 % IV BOLUS (SEPSIS)
1000.0000 mL | Freq: Once | INTRAVENOUS | Status: AC
Start: 2014-07-20 — End: 2014-07-20
  Administered 2014-07-20: 1000 mL via INTRAVENOUS

## 2014-07-20 MED ORDER — ONDANSETRON HCL 4 MG/2ML IJ SOLN
INTRAMUSCULAR | Status: AC
  Administered 2014-07-20: 4 mg via INTRAVENOUS
  Filled 2014-07-20: qty 2

## 2014-07-20 MED ORDER — GI COCKTAIL ~~LOC~~
30.0000 mL | Freq: Once | ORAL | Status: AC
  Administered 2014-07-20: 30 mL via ORAL

## 2014-07-20 MED ORDER — PANTOPRAZOLE SODIUM 40 MG PO TBEC: 40.0000 mg | DELAYED_RELEASE_TABLET | Freq: Every day | ORAL | Status: DC

## 2014-07-20 NOTE — Discharge Instructions (Signed)

## 2014-07-20 NOTE — ED Notes (Signed)
Pt reports she began having sharp epigastric pain on Wednesday. Also reports n/v/d and headache. Pain worsens after eating.

## 2014-07-20 NOTE — ED Provider Notes (Signed)
[facility] Emergency Department Provider Note  ____________________________________________  Time seen: Approximately 1245 PM  I have reviewed the triage vital signs and the nursing notes.   HISTORY  Chief Complaint Abdominal Pain and Nausea    HPI Michelle Rowland is a 29 y.o. female with a history of multiple gastric ulcers who presents today with epigastric pain.  She says that she has been having pain for the past 4-5 days which is worsened with eating. She has had multiple episodes of nonbloody vomitus. However, she says that she has been having associated diarrhea until yesterday with a small amount of blood in it. No maroon or black stools. Her reports her stool is brown with a small amount of bright red blood. No bloody stools today. No known sick contacts. No recent travel. Is not taking any medications currently for her gastritis or ulcers. Does take birth control. Says that the pain is midepigastric and also right upper quadrant and radiating to her back. It is cramping and moderate to severe especially after eating.   Past Medical History  Diagnosis Date  . Multiple gastric ulcers   . Depression   . Anxiety     There are no active problems to display for this patient.   Past Surgical History  Procedure Laterality Date  . Tonsillectomy      Current Outpatient Rx  Name  Route  Sig  Dispense  Refill  . clonazePAM (KLONOPIN) 0.5 MG tablet   Oral   Take 0.5 mg by mouth at bedtime and may repeat dose one time if needed.         Marland Kitchen escitalopram (LEXAPRO) 20 MG tablet   Oral   Take 20 mg by mouth daily.           Allergies Amoxicillin; Cephalosporins; and Penicillins  No family history on file.  Social History History  Substance Use Topics  . Smoking status: Never Smoker   . Smokeless tobacco: Not on file  . Alcohol Use: No    Review of Systems Constitutional: No fever/chills Eyes: No visual changes. ENT: No sore  throat. Cardiovascular: Denies chest pain. Respiratory: Denies shortness of breath. Gastrointestinal: No constipation. Genitourinary: Negative for dysuria. Musculoskeletal: As above Skin: Negative for rash. Neurological: Negative for headaches, focal weakness or numbness.  10-point ROS otherwise negative.  ____________________________________________   PHYSICAL EXAM:  VITAL SIGNS: ED Triage Vitals  Enc Vitals Group     BP 07/20/14 1202 122/76 mmHg     Pulse Rate 07/20/14 1202 92     Resp 07/20/14 1202 18     Temp 07/20/14 1202 98.1 F (36.7 C)     Temp Source 07/20/14 1202 Oral     SpO2 07/20/14 1202 99 %     Weight 07/20/14 1202 225 lb (102.059 kg)     Height 07/20/14 1202  (1.626 m)     Head Cir --      Peak Flow --      Pain Score 07/20/14 1202 8     Pain Loc --      Pain Edu? --      Excl. in GC? --     Constitutional: Alert and oriented. Well appearing and in no acute distress. Eyes: Conjunctivae are normal. PERRL. EOMI. Head: Atraumatic. Nose: No congestion/rhinnorhea. Mouth/Throat: Mucous membranes are moist.  Oropharynx non-erythematous. Neck: No stridor.   Cardiovascular: Normal rate, regular rhythm. Grossly normal heart sounds.  Good peripheral circulation. Respiratory: Normal respiratory effort.  No  retractions. Lungs CTAB. Gastrointestinal: Sofwith tenderness to the epigastrium and right upper quadrant. No distention.   CVA tenderness bilaterally which is greater to the right . Musculoskeletal: No lower extremity tenderness nor edema.  No joint effusions. Neurologic:  Normal speech and language. No gross focal neurologic deficits are appreciated. Speech is normal. No gait instability. Skin:  Skin is warm, dry and intact. No rash noted. Psychiatric: Mood and affect are normal. Speech and behavior are normal.  ____________________________________________   LABS (all labs ordered are listed, but only abnormal results are displayed)  Labs Reviewed   URINALYSIS COMPLETEWITH MICROSCOPIC ([facility] ONLY) - Abnormal; Notable for the following:    Color, Urine YELLOW (*)    APPearance HAZY (*)    Squamous Epithelial / LPF 6-30 (*)    All other components within normal limits  CBC WITH DIFFERENTIAL/PLATELET  COMPREHENSIVE METABOLIC PANEL  LIPASE, BLOOD  PREGNANCY, URINE  POC URINE PREG, ED   ____________________________________________  EKG  ED ECG REPORT I, Arelia Longest, the attending physician, personally viewed and interpreted this ECG.   Date: 07/20/2014  EKG Time: 941  Rate: 68  Rhythm: normal sinus rhythm  Axis: Normal axis  Intervals:none  ST&T Change: No ST elevations or depressions. No abnormal T-wave inversions  ____________________________________________  RADIOLOGY  Chest x-ray without any active disease. Normal hepatobiliary sonographic appearance. I personally reviewed the radiograph films. ____________________________________________   PROCEDURES    ____________________________________________   INITIAL IMPRESSION / ASSESSMENT AND PLAN / ED COURSE  Pertinent labs & imaging results that were available during my care of the patient were reviewed by me and considered in my medical decision making (see chart for details).  ----------------------------------------- 3:33 PM on 07/20/2014 -----------------------------------------  Patient pain relieved with GI cocktail. Able to tolerate GI cocktail with Zofran. We will discharge with PPI as well as follow-up with Dr. Mechele Collin whom she saw last year. The patient is aware of possible, patient's from gastric ulcers such as perforation and will follow-up with her primary as well as Dr. Mechele Collin. ____________________________________________   FINAL CLINICAL IMPRESSION(S) / ED DIAGNOSES  Acute gastritis. Acute epigastric abdominal pain. Initial visit.    Myrna Blazer, MD 07/20/14 [phone]

## 2014-09-14 ENCOUNTER — Emergency Department: Payer: 59

## 2014-09-14 ENCOUNTER — Emergency Department
Admission: EM | Admit: 2014-09-14 | Discharge: 2014-09-14 | Disposition: A | Discharge status: Home / Self Care | Payer: 59 | Attending: Emergency Medicine | Admitting: Emergency Medicine

## 2014-09-14 ENCOUNTER — Encounter: Payer: Self-pay | Admitting: Emergency Medicine

## 2014-09-14 ENCOUNTER — Other Ambulatory Visit: Payer: Self-pay

## 2014-09-14 DIAGNOSIS — R0602 Shortness of breath: Secondary | ICD-10-CM | POA: Insufficient documentation

## 2014-09-14 DIAGNOSIS — Z880 Allergy status to penicillin: Secondary | ICD-10-CM | POA: Insufficient documentation

## 2014-09-14 DIAGNOSIS — Z79899 Other long term (current) drug therapy: Secondary | ICD-10-CM | POA: Insufficient documentation

## 2014-09-14 DIAGNOSIS — M25511 Pain in right shoulder: Secondary | ICD-10-CM | POA: Diagnosis not present

## 2014-09-14 DIAGNOSIS — M549 Dorsalgia, unspecified: Secondary | ICD-10-CM | POA: Diagnosis not present

## 2014-09-14 DIAGNOSIS — F419 Anxiety disorder, unspecified: Secondary | ICD-10-CM

## 2014-09-14 DIAGNOSIS — R0789 Other chest pain: Secondary | ICD-10-CM | POA: Insufficient documentation

## 2014-09-14 DIAGNOSIS — R079 Chest pain, unspecified: Secondary | ICD-10-CM

## 2014-09-14 LAB — COMPREHENSIVE METABOLIC PANEL
ALK PHOS: 44 U/L (ref 38–126)
ALT: 9 U/L — ABNORMAL LOW (ref 14–54)
AST: 16 U/L (ref 15–41)
Albumin: 4.1 g/dL (ref 3.5–5.0)
Anion gap: 7 (ref 5–15)
BUN: 10 mg/dL (ref 6–20)
CALCIUM: 8.9 mg/dL (ref 8.9–10.3)
CHLORIDE: 104 mmol/L (ref 101–111)
CO2: 27 mmol/L (ref 22–32)
Creatinine, Ser: 0.94 mg/dL (ref 0.44–1.00)
Glucose, Bld: 85 mg/dL (ref 65–99)
Potassium: 3.7 mmol/L (ref 3.5–5.1)
SODIUM: 138 mmol/L (ref 135–145)
Total Bilirubin: 0.4 mg/dL (ref 0.3–1.2)
Total Protein: 7.6 g/dL (ref 6.5–8.1)

## 2014-09-14 LAB — CBC WITH DIFFERENTIAL/PLATELET
Basophils Absolute: 0 10*3/uL (ref 0–0.1)
Basophils Relative: 0 %
EOS ABS: 0.1 10*3/uL (ref 0–0.7)
Eosinophils Relative: 1 %
HCT: 37.4 % (ref 35.0–47.0)
Hemoglobin: 12.3 g/dL (ref 12.0–16.0)
LYMPHS ABS: 1.6 10*3/uL (ref 1.0–3.6)
LYMPHS PCT: 24 %
MCH: 27.5 pg (ref 26.0–34.0)
MCHC: 33 g/dL (ref 32.0–36.0)
MCV: 83.3 fL (ref 80.0–100.0)
MONO ABS: 0.5 10*3/uL (ref 0.2–0.9)
Monocytes Relative: 8 %
Neutro Abs: 4.4 10*3/uL (ref 1.4–6.5)
Neutrophils Relative %: 67 %
PLATELETS: 198 10*3/uL (ref 150–440)
RBC: 4.49 MIL/uL (ref 3.80–5.20)
RDW: 12.9 % (ref 11.5–14.5)
WBC: 6.6 10*3/uL (ref 3.6–11.0)

## 2014-09-14 LAB — TROPONIN I: Troponin I: <0.03 ng/mL (ref <0.031)

## 2014-09-14 MED ORDER — LORAZEPAM 2 MG/ML IJ SOLN
1.0000 mg | Freq: Once | INTRAMUSCULAR | Status: AC
  Administered 2014-09-14: 1 mg via INTRAVENOUS

## 2014-09-14 MED ORDER — LORAZEPAM 2 MG/ML IJ SOLN
INTRAMUSCULAR | Status: AC
  Administered 2014-09-14: 1 mg via INTRAVENOUS
  Filled 2014-09-14: qty 1

## 2014-09-14 MED ORDER — HYDROCODONE-ACETAMINOPHEN 5-325 MG PO TABS
2.0000 | ORAL_TABLET | Freq: Once | ORAL | Status: AC
  Administered 2014-09-14: 2 via ORAL
  Filled 2014-09-14: qty 2

## 2014-09-14 MED ORDER — NAPROXEN 250 MG PO TABS: 250.0000 mg | ORAL_TABLET | Freq: Two times a day (BID) | ORAL | Status: DC

## 2014-09-14 MED ORDER — MORPHINE SULFATE (PF) 4 MG/ML IV SOLN
4.0000 mg | Freq: Once | INTRAVENOUS | Status: AC
  Administered 2014-09-14: 4 mg via INTRAVENOUS
  Filled 2014-09-14: qty 1

## 2014-09-14 NOTE — ED Notes (Signed)
Patient transported to X-ray 

## 2014-09-14 NOTE — ED Provider Notes (Signed)
[facility] Emergency Department Provider Note  Time seen: 12:50 PM  I have reviewed the triage vital signs and the nursing notes.   HISTORY  Chief Complaint Chest Pain    HPI Michelle Rowland is a 29 y.o. female with a past medical history of anxiety and depression presents the emergency department chest pain 4 days. According to the patient she has been having chest tightness for the past 4 days mostly located in the central upper chest, however over the past 2 days it is now in the right shoulder and right back. She states the pain is much worse with movement or when she attempts to lift her children. Denies leg pain or swelling. Denies abdominal pain. Denies nausea, vomiting, diaphoresis, but does state mild dyspnea with her pain. She states she has had similar pain in the past ever since having her child 10 months ago, which her doctor has attributed to anxiety. The patient was recently started on Lexapro and Klonopin. Patient states her chest pain is currently 8/10 mostly in the right shoulder and right upper chest.     Past Medical History  Diagnosis Date  . Multiple gastric ulcers   . Depression   . Anxiety   . Anxiety     There are no active problems to display for this patient.   Past Surgical History  Procedure Laterality Date  . Tonsillectomy      Current Outpatient Rx  Name  Route  Sig  Dispense  Refill  . clonazePAM (KLONOPIN) 0.5 MG tablet   Oral   Take 0.5 mg by mouth at bedtime and may repeat dose one time if needed.         . ondansetron (ZOFRAN) 4 MG tablet   Oral   Take 1 tablet (4 mg total) by mouth daily as needed for nausea or vomiting.   10 tablet   1   . pantoprazole (PROTONIX) 40 MG tablet   Oral   Take 1 tablet (40 mg total) by mouth daily.   30 tablet   1   . escitalopram (LEXAPRO) 20 MG tablet   Oral   Take 20 mg by mouth daily.           Allergies Amoxicillin; Cephalosporins; and Penicillins  No  family history on file.  Social History Social History  Substance Use Topics  . Smoking status: Never Smoker   . Smokeless tobacco: None  . Alcohol Use: No    Review of Systems Constitutional: Negative for fever. Cardiovascular: Positive for upper and right upper chest pain. Respiratory: Intermittent shortness breath with her chest pain episodes. Gastrointestinal: Negative for abdominal pain, vomiting and diarrhea. Neurological: Negative for headache 10-point ROS otherwise negative.  ____________________________________________   PHYSICAL EXAM:  VITAL SIGNS: ED Triage Vitals  Enc Vitals Group     BP 09/14/14 1217 108/73 mmHg     Pulse Rate 09/14/14 1217 88     Resp 09/14/14 1217 20     Temp 09/14/14 1217 98.3 F (36.8 C)     Temp Source 09/14/14 1217 Oral     SpO2 09/14/14 1217 99 %     Weight 09/14/14 1217 225 lb (102.059 kg)     Height 09/14/14 1217 5\' 4"  (1.626 m)     Head Cir --      Peak Flow --      Pain Score 09/14/14 1218 8     Pain Loc --      Pain Edu? --  Excl. in GC? --     Constitutional: Alert and oriented. Well appearing and in no distress. Eyes: Normal exam ENT   Head: Normocephalic and atraumatic. Cardiovascular: Normal rate, regular rhythm. No murmurs, rubs, or gallops. Respiratory: Normal respiratory effort without tachypnea nor retractions. Breath sounds are clear and equal bilaterally. No wheezes/rales/rhonchi. Moderate tenderness palpation of the right upper chest, right shoulder and right back. Gastrointestinal: Soft and nontender. No distention.  Musculoskeletal: Nontender with normal range of motion in all extremities. No lower extremity tenderness or edema. Neurologic:  Normal speech and language. No gross focal neurologic deficits  Skin:  Skin is warm, dry and intact.  Psychiatric: Mood and affect are normal. Speech and behavior are normal. ____________________________________________    EKG  EKG reviewed and interpreted by  myself shows normal sinus rhythm at 76 bpm, narrow QRS, normal axis, normal intervals, no ST changes noted. Overall normal EKG.  ____________________________________________    RADIOLOGY  Chest x-ray normal  ____________________________________________   INITIAL IMPRESSION / ASSESSMENT AND PLAN / ED COURSE  Pertinent labs & imaging results that were available during my care of the patient were reviewed by me and considered in my medical decision making (see chart for details).  Patient with chest pain especially in the right upper chest and right back. Pain is worsened with movement especially lifting heavy objects. We will check labs including troponin. Chest x-ray. EKG is within normal limits.  Labs normal, chest x-ray normal. Patient began hyperventilating, saying she was having a panic attack, dates it was consistent with prior panic attacks. Denied any chest pain. Given 1 mg of Ativan, with good resolution. I discussed with the patient the need for her to follow-up with [facility] to speak to a psychiatrist regarding her worsening anxiety. Patient is agreeable to plan. She will also follow up with her primary care doctor soon as her primary care doctor is back in town.  ____________________________________________   FINAL CLINICAL IMPRESSION(S) / ED DIAGNOSES  Chest pain Chest wall pain Anxiety  Minna Antis, MD 09/14/14 1506

## 2014-09-14 NOTE — ED Notes (Signed)
Pt reports chest pain for 4 days denies any shortness of breath. Talks in complete sentences, pt reports history of Anxiety . Pt reports she lifted her 31 year old son and 23 month old baby, no other injury reported to area.

## 2014-09-14 NOTE — Discharge Instructions (Signed)
Chest Pain (Nonspecific) °It is often hard to give a specific diagnosis for the cause of chest pain. There is always a chance that your pain could be related to something serious, such as a heart attack or a blood clot in the lungs. You need to follow up with your health care provider for further evaluation. °CAUSES  °· Heartburn. °· Pneumonia or bronchitis. °· Anxiety or stress. °· Inflammation around your heart (pericarditis) or lung (pleuritis or pleurisy). °· A blood clot in the lung. °· A collapsed lung (pneumothorax). It can develop suddenly on its own (spontaneous pneumothorax) or from trauma to the chest. °· Shingles infection (herpes zoster virus). °The chest wall is composed of bones, muscles, and cartilage. Any of these can be the source of the pain. °· The bones can be bruised by injury. °· The muscles or cartilage can be strained by coughing or overwork. °· The cartilage can be affected by inflammation and become sore (costochondritis). °DIAGNOSIS  °Lab tests or other studies may be needed to find the cause of your pain. Your health care provider may have you take a test called an ambulatory electrocardiogram (ECG). An ECG records your heartbeat patterns over a 24-hour period. You may also have other tests, such as: °· Transthoracic echocardiogram (TTE). During echocardiography, sound waves are used to evaluate how blood flows through your heart. °· Transesophageal echocardiogram (TEE). °· Cardiac monitoring. This allows your health care provider to monitor your heart rate and rhythm in real time. °· Holter monitor. This is a portable device that records your heartbeat and can help diagnose heart arrhythmias. It allows your health care provider to track your heart activity for several days, if needed. °· Stress tests by exercise or by giving medicine that makes the heart beat faster. °TREATMENT  °· Treatment depends on what may be causing your chest pain. Treatment may include: °· Acid blockers for  heartburn. °· Anti-inflammatory medicine. °· Pain medicine for inflammatory conditions. °· Antibiotics if an infection is present. °· You may be advised to change lifestyle habits. This includes stopping smoking and avoiding alcohol, caffeine, and chocolate. °· You may be advised to keep your head raised (elevated) when sleeping. This reduces the chance of acid going backward from your stomach into your esophagus. °Most of the time, nonspecific chest pain will improve within 2-3 days with rest and mild pain medicine.  °HOME CARE INSTRUCTIONS  °· If antibiotics were prescribed, take them as directed. Finish them even if you start to feel better. °· For the next few days, avoid physical activities that bring on chest pain. Continue physical activities as directed. °· Do not use any tobacco products, including cigarettes, chewing tobacco, or electronic cigarettes. °· Avoid drinking alcohol. °· Only take medicine as directed by your health care provider. °· Follow your health care provider's suggestions for further testing if your chest pain does not go away. °· Keep any follow-up appointments you made. If you do not go to an appointment, you could develop lasting (chronic) problems with pain. If there is any problem keeping an appointment, call to reschedule. °SEEK MEDICAL CARE IF:  °· Your chest pain does not go away, even after treatment. °· You have a rash with blisters on your chest. °· You have a fever. °SEEK IMMEDIATE MEDICAL CARE IF:  °· You have increased chest pain or pain that spreads to your arm, neck, jaw, back, or abdomen. °· You have shortness of breath. °· You have an increasing cough, or you cough   up blood. °· You have severe back or abdominal pain. °· You feel nauseous or vomit. °· You have severe weakness. °· You faint. °· You have chills. °This is an emergency. Do not wait to see if the pain will go away. Get medical help at once. Call your local emergency services (911 in [place] Do not drive  yourself to the hospital. °MAKE SURE YOU:  °· Understand these instructions. °· Will watch your condition. °· Will get help right away if you are not doing well or get worse. °Document Released: 10/20/2004 Document Revised: 01/15/2013 Document Reviewed: 08/16/2007 °[employer]® Patient Information ©2015 [employer]. This information is not intended to replace advice given to you by your health care provider. Make sure you discuss any questions you have with your health care provider. ° °Panic Attacks °Panic attacks are sudden, short feelings of great fear or discomfort. You may have them for no reason when you are relaxed, when you are uneasy (anxious), or when you are sleeping.  °HOME CARE °· Take all your medicines as told. °· Check with your doctor before starting new medicines. °· Keep all doctor visits. °GET HELP IF: °· You are not able to take your medicines as told. °· Your symptoms do not get better. °· Your symptoms get worse. °GET HELP RIGHT AWAY IF: °· Your attacks seem different than your normal attacks. °· You have thoughts about hurting yourself or others. °· You take panic attack medicine and you have a side effect. °MAKE SURE YOU: °· Understand these instructions. °· Will watch your condition. °· Will get help right away if you are not doing well or get worse. °Document Released: 02/12/2010 Document Revised: 10/31/2012 Document Reviewed: 08/24/2012 °Exit[employer]® Patient Information ©2015 [employer]. This information is not intended to replace advice given to you by your health care provider. Make sure you discuss any questions you have with your health care provider. ° °

## 2014-11-30 ENCOUNTER — Encounter: Payer: Self-pay | Admitting: Emergency Medicine

## 2014-11-30 ENCOUNTER — Emergency Department
Admission: EM | Admit: 2014-11-30 | Discharge: 2014-11-30 | Disposition: A | Discharge status: Home / Self Care | Payer: 59 | Attending: Emergency Medicine | Admitting: Emergency Medicine

## 2014-11-30 ENCOUNTER — Emergency Department: Payer: 59

## 2014-11-30 DIAGNOSIS — J40 Bronchitis, not specified as acute or chronic: Secondary | ICD-10-CM

## 2014-11-30 DIAGNOSIS — F410 Panic disorder [episodic paroxysmal anxiety] without agoraphobia: Secondary | ICD-10-CM | POA: Insufficient documentation

## 2014-11-30 DIAGNOSIS — Z792 Long term (current) use of antibiotics: Secondary | ICD-10-CM | POA: Diagnosis not present

## 2014-11-30 DIAGNOSIS — Z791 Long term (current) use of non-steroidal anti-inflammatories (NSAID): Secondary | ICD-10-CM | POA: Diagnosis not present

## 2014-11-30 DIAGNOSIS — Z79899 Other long term (current) drug therapy: Secondary | ICD-10-CM | POA: Insufficient documentation

## 2014-11-30 DIAGNOSIS — Z880 Allergy status to penicillin: Secondary | ICD-10-CM | POA: Diagnosis not present

## 2014-11-30 DIAGNOSIS — Z3202 Encounter for pregnancy test, result negative: Secondary | ICD-10-CM | POA: Diagnosis not present

## 2014-11-30 DIAGNOSIS — J209 Acute bronchitis, unspecified: Secondary | ICD-10-CM | POA: Insufficient documentation

## 2014-11-30 LAB — POCT PREGNANCY, URINE: Preg Test, Ur: NEGATIVE

## 2014-11-30 MED ORDER — LORAZEPAM 2 MG/ML IJ SOLN
1.0000 mg | Freq: Once | INTRAMUSCULAR | Status: AC
  Administered 2014-11-30: 1 mg via INTRAMUSCULAR
  Filled 2014-11-30: qty 1

## 2014-11-30 NOTE — Discharge Instructions (Signed)

## 2014-11-30 NOTE — ED Notes (Signed)
Pt reports being treated for bronchitis with zpack for the last 3 days. Pt reports hx of anxiety and panic attacks. Pt reports last night she developed a panic attack and has not been able to get it under control. Pt has taken  Her klonopin twice today to help her sx. Pt noted to be holding her breath and taking shallow breaths. Pt noted to have a dry hacking cough. Pt instructed in relaxation techniques and encouraged to take slow deep breaths. Pt understanding.

## 2014-11-30 NOTE — ED Provider Notes (Signed)
[facility] Emergency Department Provider Note  ____________________________________________   I have reviewed the triage vital signs and the nursing notes.   HISTORY  Chief Complaint Bronchitis; Chest Pain; and Panic Attack    HPI LAQUESHIA CIHLAR is a 29 y.o. female with a history of anxiety attacks and panic attacks. She states she is having a panic attack. She has 6 children at home and she herself is sick and she works and she feels somewhat overwhelmed. She has no SI or HI. She tried her home, I am with minimal success. She has had no fever no chills no nausea no vomiting but she has been coughing is taking antibiotics and started to develop a mild meningitis. 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 She has had no thoughts of hurting herself or others. She just feels overwhelmed and panicky.  Past Medical History  Diagnosis Date  . Multiple gastric ulcers   . Depression   . Anxiety   . Anxiety     There are no active problems to display for this patient.   Past Surgical History  Procedure Laterality Date  . Tonsillectomy      Current Outpatient Rx  Name  Route  Sig  Dispense  Refill  . azithromycin (ZITHROMAX) 250 MG tablet   Oral   Take 250-500 mg by mouth See admin instructions. Take 2 tablets ( ) orally now, then take 1 tablet orally once a day x 4 days         . benzonatate (TESSALON) 100 MG capsule   Oral   Take 100 mg by mouth every 8 (eight) hours.          . clonazePAM (KLONOPIN) 0.5 MG disintegrating tablet   Oral   Take 0.5 mg by mouth 3 (three) times daily as needed. For anxiety      1   . Norethindrone Acetate-Ethinyl Estrad-FE (LOESTRIN 24 FE) 1-20 MG-MCG(24) tablet   Oral   Take 1 tablet by mouth daily.      9   . venlafaxine XR (EFFEXOR-XR) 75 MG 24 hr capsule   Oral   Take 75 mg by mouth daily.         . naproxen (NAPROSYN) 250 MG tablet   Oral   Take 1 tablet (250 mg total) by  mouth 2 (two) times daily with a meal.   30 tablet   0   . ondansetron (ZOFRAN) 4 MG tablet   Oral   Take 1 tablet (4 mg total) by mouth daily as needed for nausea or vomiting.   10 tablet   1   . pantoprazole (PROTONIX) 40 MG tablet   Oral   Take 1 tablet (40 mg total) by mouth daily.   30 tablet   1     Allergies Amoxicillin; Cephalosporins; and Penicillins  History reviewed. No pertinent family history.  Social History Social History  Substance Use Topics  . Smoking status: Never Smoker   . Smokeless tobacco: None  . Alcohol Use: No    Review of Systems Constitutional: No fever/chills Eyes: No visual changes. ENT: No sore throat. No stiff neck no neck pain Cardiovascular: Denies chest pain. Respiratory: Positive for cough denies significant shortness of breath. Gastrointestinal:   no vomiting.  No diarrhea.  No constipation. Genitourinary: Negative for dysuria. Musculoskeletal: Negative lower extremity swelling Skin: Negative for rash. Neurological: Negative for headaches, focal weakness or numbness. 10-point ROS otherwise negative.  ____________________________________________  PHYSICAL EXAM:  VITAL SIGNS: ED Triage Vitals  Enc Vitals Group     BP 11/30/14 1937 135/94 mmHg     Pulse Rate 11/30/14 1937 104     Resp 11/30/14 1937 22     Temp 11/30/14 1937 98.2 F (36.8 C)     Temp Source 11/30/14 1937 Oral     SpO2 11/30/14 1937 99 %     Weight 11/30/14 1937 226 lb (102.513 kg)     Height 11/30/14 1937 5\' 4"  (1.626 m)     Head Cir --      Peak Flow --      Pain Score 11/30/14 1938 8     Pain Loc --      Pain Edu? --      Excl. in GC? --     Constitutional: Alert and oriented. Well appearing and in no acute distress. Very anxious initially, Eyes: Conjunctivae are normal. PERRL. EOMI. Head: Atraumatic. Nose: No congestion/rhinnorhea. Mouth/Throat: Mucous membranes are moist.  Oropharynx non-erythematous. Has a mildly hoarse voice Neck: No  stridor.   Nontender with no meningismus Cardiovascular: Normal rate, regular rhythm. Grossly normal heart sounds.  Good peripheral circulation. Respiratory: Normal respiratory effort.  No retractions. Lungs CTAB. Regional cough Abdominal: Soft and nontender. No distention. No guarding no rebound Back:  There is no focal tenderness or step off there is no midline tenderness there are no lesions noted. there is no CVA tenderness Musculoskeletal: No lower extremity tenderness. No joint effusions, no DVT signs strong distal pulses no edema Neurologic:  Normal speech and language. No gross focal neurologic deficits are appreciated.  Skin:  Skin is warm, dry and intact. No rash noted. Psychiatric: Mood and affect are anxious. Speech and behavior are normal.  ____________________________________________   LABS (all labs ordered are listed, but only abnormal results are displayed)  Labs Reviewed  POC URINE PREG, ED  POCT PREGNANCY, URINE   ____________________________________________  EKG  I personally interpreted any EKGs ordered by me or triage Normal sinus rhythm rate 90 bpm no acute ST elevation or acute ST depression normal axis unremarkable EKG ____________________________________________  RADIOLOGY  I reviewed any imaging ordered by me or triage that were performed during my shift ____________________________________________   PROCEDURES  Procedure(s) performed: None  Critical Care performed: None  ____________________________________________   INITIAL IMPRESSION / ASSESSMENT AND PLAN / ED COURSE  Pertinent labs & imaging results that were available during my care of the patient were reviewed by me and considered in my medical decision making (see chart for details).  Patient here with a panic attack. No evidence of PE or dissection ACS myocarditis endocarditis pericarditis or other acute intrathoracic pathology aside from what appears to be bronchitis for which she is  taking antibiotics. Patient's airway is patent and she has no acute respiratory issues. She can get a ride home. We did give her a milligram of Ativan I coached her breathing and very quickly she calmed down at this time she feels as if the wave of tension his left her body, and she feels 100% better. She would like a note for work. She has no thoughts of harming herself or anyone else. She is quite well-appearing and we will discharge her. ____________________________________________   FINAL CLINICAL IMPRESSION(S) / ED DIAGNOSES  Final diagnoses:  None     Jeanmarie PlantJames A Faustine Tates, MD 11/30/14 2209

## 2014-11-30 NOTE — ED Notes (Addendum)
Pt says she is currently taking antibiotics for bronchitis; says last night she began having a panic attacks; taking her Klonopin as prescribed for same but not helping; c/o chest pain across the top of her chest; short of breath; pt tearful in triage; pt says she has not slept well in several days; adds her 31 year old is home with bronchitis and her baby has RSV and pneumonia

## 2014-12-07 ENCOUNTER — Encounter: Payer: Self-pay | Admitting: Emergency Medicine

## 2014-12-07 ENCOUNTER — Emergency Department
Admission: EM | Admit: 2014-12-07 | Discharge: 2014-12-07 | Disposition: A | Discharge status: Home / Self Care | Payer: 59 | Attending: Emergency Medicine | Admitting: Emergency Medicine

## 2014-12-07 ENCOUNTER — Emergency Department: Payer: 59

## 2014-12-07 DIAGNOSIS — J069 Acute upper respiratory infection, unspecified: Secondary | ICD-10-CM | POA: Insufficient documentation

## 2014-12-07 DIAGNOSIS — Z880 Allergy status to penicillin: Secondary | ICD-10-CM | POA: Diagnosis not present

## 2014-12-07 DIAGNOSIS — R079 Chest pain, unspecified: Secondary | ICD-10-CM | POA: Diagnosis present

## 2014-12-07 MED ORDER — HYDROCOD POLST-CPM POLST ER 10-8 MG/5ML PO SUER
5.0000 mL | Freq: Once | ORAL | Status: AC
  Administered 2014-12-07: 5 mL via ORAL

## 2014-12-07 MED ORDER — PREDNISONE 20 MG PO TABS
60.0000 mg | ORAL_TABLET | Freq: Once | ORAL | Status: AC
  Administered 2014-12-07: 60 mg via ORAL

## 2014-12-07 MED ORDER — HYDROCOD POLST-CPM POLST ER 10-8 MG/5ML PO SUER: 5.0000 mL | Freq: Two times a day (BID) | ORAL | Status: DC

## 2014-12-07 MED ORDER — HYDROCOD POLST-CPM POLST ER 10-8 MG/5ML PO SUER
ORAL | Status: AC
  Administered 2014-12-07: 5 mL via ORAL
  Filled 2014-12-07: qty 5

## 2014-12-07 MED ORDER — PREDNISONE 20 MG PO TABS
ORAL_TABLET | ORAL | Status: AC
  Administered 2014-12-07: 60 mg via ORAL
  Filled 2014-12-07: qty 3

## 2014-12-07 MED ORDER — PREDNISONE 50 MG PO TABS
ORAL_TABLET | ORAL | Status: DC
Start: 2014-12-07 — End: 2015-02-25

## 2014-12-07 MED ORDER — ALBUTEROL SULFATE (2.5 MG/3ML) 0.083% IN NEBU: 5.0000 mg | INHALATION_SOLUTION | Freq: Once | RESPIRATORY_TRACT | Status: DC

## 2014-12-07 NOTE — ED Notes (Signed)
Pt states she has had productive cough x 2 weeks, states for the past week she has had left sided cp, states she was seen earlier in the week in ED and then Wednesday at her PCP and told she had pneumonia and placed on antibiotic, states she is not getting any better and cp and upper back has not gotten any better and she has worsening sob

## 2014-12-07 NOTE — ED Notes (Signed)
Pt alert and oriented X4, active, cooperative, pt in NAD. RR even and unlabored, color WNL.  Pt informed to return if any life threatening symptoms occur.   

## 2014-12-07 NOTE — Discharge Instructions (Signed)
Upper Respiratory Infection, Adult Most upper respiratory infections (URIs) are a viral infection of the air passages leading to the lungs. A URI affects the nose, throat, and upper air passages. The most common type of URI is nasopharyngitis and is typically referred to as "the common cold." URIs run their course and usually go away on their own. Most of the time, a URI does not require medical attention, but sometimes a bacterial infection in the upper airways can follow a viral infection. This is called a secondary infection. Sinus and middle ear infections are common types of secondary upper respiratory infections. Bacterial pneumonia can also complicate a URI. A URI can worsen asthma and chronic obstructive pulmonary disease (COPD). Sometimes, these complications can require emergency medical care and may be life threatening.  CAUSES Almost all URIs are caused by viruses. A virus is a type of germ and can spread from one person to another.  RISKS FACTORS You may be at risk for a URI if:   You smoke.   You have chronic heart or lung disease.  You have a weakened defense (immune) system.   You are very young or very old.   You have nasal allergies or asthma.  You work in crowded or poorly ventilated areas.  You work in health care facilities or schools. SIGNS AND SYMPTOMS  Symptoms typically develop 2-3 days after you come in contact with a cold virus. Most viral URIs last 7-10 days. However, viral URIs from the influenza virus (flu virus) can last 14-18 days and are typically more severe. Symptoms may include:   Runny or stuffy (congested) nose.   Sneezing.   Cough.   Sore throat.   Headache.   Fatigue.   Fever.   Loss of appetite.   Pain in your forehead, behind your eyes, and over your cheekbones (sinus pain).  Muscle aches.  DIAGNOSIS  Your health care provider may diagnose a URI by:  Physical exam.  Tests to check that your symptoms are not due to  another condition such as:  Strep throat.  Sinusitis.  Pneumonia.  Asthma. TREATMENT  A URI goes away on its own with time. It cannot be cured with medicines, but medicines may be prescribed or recommended to relieve symptoms. Medicines may help:  Reduce your fever.  Reduce your cough.  Relieve nasal congestion. HOME CARE INSTRUCTIONS   Take medicines only as directed by your health care provider.   Gargle warm saltwater or take cough drops to comfort your throat as directed by your health care provider.  Use a warm mist humidifier or inhale steam from a shower to increase air moisture. This may make it easier to breathe.  Drink enough fluid to keep your urine clear or pale yellow.   Eat soups and other clear broths and maintain good nutrition.   Rest as needed.   Return to work when your temperature has returned to normal or as your health care provider advises. You may need to stay home longer to avoid infecting others. You can also use a face mask and careful hand washing to prevent spread of the virus.  Increase the usage of your inhaler if you have asthma.   Do not use any tobacco products, including cigarettes, chewing tobacco, or electronic cigarettes. If you need help quitting, ask your health care provider. PREVENTION  The best way to protect yourself from getting a cold is to practice good hygiene.   Avoid oral or hand contact with people with cold   symptoms.   Wash your hands often if contact occurs.  There is no clear evidence that vitamin C, vitamin E, echinacea, or exercise reduces the chance of developing a cold. However, it is always recommended to get plenty of rest, exercise, and practice good nutrition.  SEEK MEDICAL CARE IF:   You are getting worse rather than better.   Your symptoms are not controlled by medicine.   You have chills.  You have worsening shortness of breath.  You have brown or red mucus.  You have yellow or brown nasal  discharge.  You have pain in your face, especially when you bend forward.  You have a fever.  You have swollen neck glands.  You have pain while swallowing.  You have white areas in the back of your throat. SEEK IMMEDIATE MEDICAL CARE IF:   You have severe or persistent:  Headache.  Ear pain.  Sinus pain.  Chest pain.  You have chronic lung disease and any of the following:  Wheezing.  Prolonged cough.  Coughing up blood.  A change in your usual mucus.  You have a stiff neck.  You have changes in your:  Vision.  Hearing.  Thinking.  Mood. MAKE SURE YOU:   Understand these instructions.  Will watch your condition.  Will get help right away if you are not doing well or get worse.   This information is not intended to replace advice given to you by your health care provider. Make sure you discuss any questions you have with your health care provider.   Document Released: 07/06/2000 Document Revised: 05/27/2014 Document Reviewed: 04/17/2013 Elsevier Interactive Patient Education 2016 [employer]  

## 2014-12-07 NOTE — ED Provider Notes (Signed)
[facility] Emergency Department Provider Note     Time seen: ----------------------------------------- 4:27 PM on 12/07/2014 -----------------------------------------    I have reviewed the triage vital signs and the nursing notes.   HISTORY  Chief Complaint Chest Pain; Cough; and Shortness of Breath    HPI Michelle Rowland is a [age] y.o. female who presents ER for continued coughing, chest pain and bronchitis symptoms. Patient states she continues to have body aches, feeling cold, sore throat. Recent issues seen here diagnosed with bronchitis and has been on antibiotics and steroids without any improvement. She denies any fevers at this time.   Past Medical History  Diagnosis Date  . Multiple gastric ulcers   . Depression   . Anxiety   . Anxiety     There are no active problems to display for this patient.   Past Surgical History  Procedure Laterality Date  . Tonsillectomy      Allergies Amoxicillin; Cephalosporins; and Penicillins  Social History Social History  Substance Use Topics  . Smoking status: Never Smoker   . Smokeless tobacco: None  . Alcohol Use: No    Review of Systems Constitutional: Negative for fever. Eyes: Negative for visual changes. ENT: Positive for sore throat Cardiovascular: Negative for chest pain. Respiratory: Negative for shortness of breath. Positive for cough and congestion Gastrointestinal: Negative for abdominal pain, vomiting and diarrhea. Genitourinary: Negative for dysuria. Musculoskeletal: Negative for back pain. Skin: Negative for rash. Neurological: Negative for headaches, focal weakness or numbness.  10-point ROS otherwise negative.  ____________________________________________   PHYSICAL EXAM:  VITAL SIGNS: ED Triage Vitals  Enc Vitals Group     BP 12/07/14 1521 138/87 mmHg     Pulse Rate 12/07/14 1521 91     Resp 12/07/14 1521 18     Temp 12/07/14 1521 98.5 F (36.9 C)     Temp  Source 12/07/14 1521 Oral     SpO2 12/07/14 1521 97 %     Weight 12/07/14 1521 226 lb (102.513 kg)     Height 12/07/14 1521 5\' 4"  (1.626 m)     Head Cir --      Peak Flow --      Pain Score 12/07/14 1522 8     Pain Loc --      Pain Edu? --      Excl. in GC? --     Constitutional: Alert and oriented. Well appearing and in no distress. Eyes: Conjunctivae are normal. PERRL. Normal extraocular movements. ENT   Head: Normocephalic and atraumatic.   Nose: No congestion/rhinnorhea.   Mouth/Throat: Mucous membranes are moist.   Neck: No stridor. Cardiovascular: Normal rate, regular rhythm. Normal and symmetric distal pulses are present in all extremities. No murmurs, rubs, or gallops. Respiratory: Normal respiratory effort without tachypnea nor retractions. Breath sounds are clear and equal bilaterally. No wheezes/rales/rhonchi. Gastrointestinal: Soft and nontender. No distention. No abdominal bruits.  Musculoskeletal: Nontender with normal range of motion in all extremities. No joint effusions.  No lower extremity tenderness nor edema. Neurologic:  Normal speech and language. No gross focal neurologic deficits are appreciated. Speech is normal. No gait instability. Skin:  Skin is warm, dry and intact. No rash noted. Psychiatric: Mood and affect are normal. Speech and behavior are normal. Patient exhibits appropriate insight and judgment. ____________________________________________  EKG: Interpreted by me. Normal sinus rhythm with normal axis normal intervals, no evidence of hypertrophy or acute infarction, rate is 75 bpm  ____________________________________________  ED COURSE:  Pertinent labs &  imaging results that were available during my care of the patient were reviewed by me and considered in my medical decision making (see chart for details). Patient clinically with persistent upper respiratory infection. We'll check chest x-ray and  reevaluate. ____________________________________________  RADIOLOGY Images were viewed by me  Chest x-ray is unremarkable  ____________________________________________  FINAL ASSESSMENT AND PLAN  Upper respiratory infection  Plan: Patient with labs and imaging as dictated above. Patient will be prescribed [place] next and a steroid taper,x will not help this viral infection. She is stable for outpatient follow-up.   Emily Filbert, MD   Emily Filbert, MD 12/07/14 [id number]

## 2014-12-07 NOTE — ED Notes (Signed)
Pt reports being dx with bronchitis X 1 week ago. Pt c/o chest pain with coughing that radiates to back, headache, body aches and "feeling freezing". Pt alert and oriented X4, active, cooperative, pt in NAD. RR even and unlabored, color WNL.

## 2014-12-29 ENCOUNTER — Encounter: Payer: Self-pay | Admitting: Emergency Medicine

## 2014-12-29 ENCOUNTER — Emergency Department
Admission: EM | Admit: 2014-12-29 | Discharge: 2014-12-29 | Disposition: A | Discharge status: Home / Self Care | Payer: 59 | Attending: Emergency Medicine | Admitting: Emergency Medicine

## 2014-12-29 DIAGNOSIS — Z880 Allergy status to penicillin: Secondary | ICD-10-CM | POA: Insufficient documentation

## 2014-12-29 DIAGNOSIS — Z791 Long term (current) use of non-steroidal anti-inflammatories (NSAID): Secondary | ICD-10-CM | POA: Insufficient documentation

## 2014-12-29 DIAGNOSIS — Z79899 Other long term (current) drug therapy: Secondary | ICD-10-CM | POA: Diagnosis not present

## 2014-12-29 DIAGNOSIS — Z3202 Encounter for pregnancy test, result negative: Secondary | ICD-10-CM | POA: Diagnosis not present

## 2014-12-29 DIAGNOSIS — F419 Anxiety disorder, unspecified: Secondary | ICD-10-CM | POA: Diagnosis not present

## 2014-12-29 DIAGNOSIS — G44209 Tension-type headache, unspecified, not intractable: Secondary | ICD-10-CM | POA: Insufficient documentation

## 2014-12-29 DIAGNOSIS — Z7952 Long term (current) use of systemic steroids: Secondary | ICD-10-CM | POA: Diagnosis not present

## 2014-12-29 HISTORY — DX: Migraine, unspecified, not intractable, without status migrainosus: G43.909

## 2014-12-29 LAB — POCT PREGNANCY, URINE: PREG TEST UR: NEGATIVE

## 2014-12-29 MED ORDER — KETOROLAC TROMETHAMINE 60 MG/2ML IM SOLN
60.0000 mg | Freq: Once | INTRAMUSCULAR | Status: AC
  Administered 2014-12-29: 60 mg via INTRAMUSCULAR
  Filled 2014-12-29: qty 2

## 2014-12-29 NOTE — ED Notes (Signed)
Pt discharged home after verbalizing understanding of discharge instructions; nad noted. 

## 2014-12-29 NOTE — Discharge Instructions (Signed)
Generalized Anxiety Disorder Generalized anxiety disorder (GAD) is a mental disorder. It interferes with life functions, including relationships, work, and school. GAD is different from normal anxiety, which everyone experiences at some point in their lives in response to specific life events and activities. Normal anxiety actually helps us prepare for and get through these life events and activities. Normal anxiety goes away after the event or activity is over.  GAD causes anxiety that is not necessarily related to specific events or activities. It also causes excess anxiety in proportion to specific events or activities. The anxiety associated with GAD is also difficult to control. GAD can vary from mild to severe. People with severe GAD can have intense waves of anxiety with physical symptoms (panic attacks).  SYMPTOMS The anxiety and worry associated with GAD are difficult to control. This anxiety and worry are related to many life events and activities and also occur more days than not for 6 months or longer. People with GAD also have three or more of the following symptoms (one or more in children):  Restlessness.   Fatigue.  Difficulty concentrating.   Irritability.  Muscle tension.  Difficulty sleeping or unsatisfying sleep. DIAGNOSIS GAD is diagnosed through an assessment by your health care provider. Your health care provider will ask you questions aboutyour mood,physical symptoms, and events in your life. Your health care provider may ask you about your medical history and use of alcohol or drugs, including prescription medicines. Your health care provider may also do a physical exam and blood tests. Certain medical conditions and the use of certain substances can cause symptoms similar to those associated with GAD. Your health care provider may refer you to a mental health specialist for further evaluation. TREATMENT The following therapies are usually used to treat GAD:    Medication. Antidepressant medication usually is prescribed for long-term daily control. Antianxiety medicines may be added in severe cases, especially when panic attacks occur.   Talk therapy (psychotherapy). Certain types of talk therapy can be helpful in treating GAD by providing support, education, and guidance. A form of talk therapy called cognitive behavioral therapy can teach you healthy ways to think about and react to daily life events and activities.  Stress managementtechniques. These include yoga, meditation, and exercise and can be very helpful when they are practiced regularly. A mental health specialist can help determine which treatment is best for you. Some people see improvement with one therapy. However, other people require a combination of therapies.   This information is not intended to replace advice given to you by your health care provider. Make sure you discuss any questions you have with your health care provider.   Document Released: 05/07/2012 Document Revised: 01/31/2014 Document Reviewed: 05/07/2012 Elsevier Interactive Patient Education 2016 [employer]  General Headache Without Cause A headache is pain or discomfort felt around the head or neck area. There are many causes and types of headaches. In some cases, the cause may not be found.  HOME CARE  Managing Pain  Take over-the-counter and prescription medicines only as told by your doctor.  Lie down in a dark, quiet room when you have a headache.  If directed, apply ice to the head and neck area:  Put ice in a plastic bag.  Place a towel between your skin and the bag.  Leave the ice on for 20 minutes, 2-3 times per day.  Use a heating pad or hot shower to apply heat to the head and neck area as told  by your doctor.  Keep lights dim if bright lights bother you or make your headaches worse. Eating and Drinking  Eat meals on a regular schedule.  Lessen how much alcohol you drink.  Lessen  how much caffeine you drink, or stop drinking caffeine. General Instructions  Keep all follow-up visits as told by your doctor. This is important.  Keep a journal to find out if certain things bring on headaches. For example, write down:  What you eat and drink.  How much sleep you get.  Any change to your diet or medicines.  Relax by getting a massage or doing other relaxing activities.  Lessen stress.  Sit up straight. Do not tighten (tense) your muscles.  Do not use tobacco products. This includes cigarettes, chewing tobacco, or e-cigarettes. If you need help quitting, ask your doctor.  Exercise regularly as told by your doctor.  Get enough sleep. This often means 7-9 hours of sleep. GET HELP IF:  Your symptoms are not helped by medicine.  You have a headache that feels different than the other headaches.  You feel sick to your stomach (nauseous) or you throw up (vomit).  You have a fever. GET HELP RIGHT AWAY IF:   Your headache becomes really bad.  You keep throwing up.  You have a stiff neck.  You have trouble seeing.  You have trouble speaking.  You have pain in the eye or ear.  Your muscles are weak or you lose muscle control.  You lose your balance or have trouble walking.  You feel like you will pass out (faint) or you pass out.  You have confusion.   This information is not intended to replace advice given to you by your health care provider. Make sure you discuss any questions you have with your health care provider.   Document Released: 10/20/2007 Document Revised: 10/01/2014 Document Reviewed: 05/05/2014 Elsevier Interactive Patient Education [employer]   Continue taking headache medication as directed by your doctor. Get further instructions from your doctor in relationship to your depression and anxiety medication.

## 2014-12-29 NOTE — ED Notes (Signed)
Migraine and increased anxiety with trouble sleeping x 3 days, history of same.

## 2014-12-29 NOTE — ED Provider Notes (Signed)
[facility] Emergency Department Provider Note  ____________________________________________  Time seen: Approximately 10:24 AM  I have reviewed the triage vital signs and the nursing notes.   HISTORY  Chief Complaint Migraine and Anxiety   HPI Michelle Rowland is a 29 y.o. female is here today with complaint of increased anxiety, migraine, trouble sleeping for 3 days and developing hives at 8 AM this morning which is now resolved. Patient denies any difficulty breathing. She currently has medication for migraines which she took this morning. She also continues to take her anxiety and depression medicine and has called her psychiatrist this morning and left message. She denies any upper respiratory, urinary symptoms, fever or chills. Currently she is not having any photophobia, nausea or vomiting, fever or chills. She states that the last normal period that she had was in October and had only some mild spotting and November.   Past Medical History  Diagnosis Date  . Multiple gastric ulcers   . Depression   . Anxiety   . Anxiety   . Migraines     There are no active problems to display for this patient.   Past Surgical History  Procedure Laterality Date  . Tonsillectomy      Current Outpatient Rx  Name  Route  Sig  Dispense  Refill  . benzonatate (TESSALON) 100 MG capsule   Oral   Take 100 mg by mouth every 8 (eight) hours.          . chlorpheniramine-HYDROcodone (TUSSIONEX PENNKINETIC ER) 10-8 MG/5ML SUER   Oral   Take 5 mLs by mouth 2 (two) times daily.   140 mL   0   . clonazePAM (KLONOPIN) 0.5 MG disintegrating tablet   Oral   Take 0.5 mg by mouth 3 (three) times daily as needed. For anxiety      1   . naproxen (NAPROSYN) 250 MG tablet   Oral   Take 1 tablet (250 mg total) by mouth 2 (two) times daily with a meal.   30 tablet   0   . Norethindrone Acetate-Ethinyl Estrad-FE (LOESTRIN 24 FE) 1-20 MG-MCG(24) tablet   Oral   Take 1  tablet by mouth daily.      9   . ondansetron (ZOFRAN) 4 MG tablet   Oral   Take 1 tablet (4 mg total) by mouth daily as needed for nausea or vomiting.   10 tablet   1   . pantoprazole (PROTONIX) 40 MG tablet   Oral   Take 1 tablet (40 mg total) by mouth daily.   30 tablet   1   . predniSONE (DELTASONE) 50 MG tablet      One tablet by mouth daily   4 tablet   0   . venlafaxine XR (EFFEXOR-XR) 75 MG 24 hr capsule   Oral   Take 75 mg by mouth daily.           Allergies Amoxicillin; Cephalosporins; and Penicillins  No family history on file.  Social History Social History  Substance Use Topics  . Smoking status: Never Smoker   . Smokeless tobacco: None  . Alcohol Use: No    Review of Systems Constitutional: No fever/chills Eyes: No visual changes. ENT: No sore throat. Cardiovascular: Denies chest pain. Respiratory: Denies shortness of breath. Gastrointestinal: No abdominal pain.  No nausea, no vomiting.  No diarrhea.  No constipation. Genitourinary: Negative for dysuria. Musculoskeletal: Negative for back pain. Skin: Negative for rash. Neurological: Positive for headaches,  no focal weakness or numbness. Psychiatric:History of anxiety and depression 10-point ROS otherwise negative.  ____________________________________________   PHYSICAL EXAM:  VITAL SIGNS: ED Triage Vitals  Enc Vitals Group     BP 12/29/14 1003 130/96 mmHg     Pulse Rate 12/29/14 1003 86     Resp 12/29/14 1003 18     Temp 12/29/14 1003 98 F (36.7 C)     Temp Source 12/29/14 1003 Oral     SpO2 12/29/14 1003 99 %     Weight 12/29/14 1003 226 lb (102.513 kg)     Height 12/29/14 1003 5\' 4"  (1.626 m)     Head Cir --      Peak Flow --      Pain Score 12/29/14 1004 9     Pain Loc --      Pain Edu? --      Excl. in GC? --     Constitutional: Alert and oriented. Well appearing and in no acute distress. Eyes: Conjunctivae are normal. PERRL. EOMI. no photophobia Head:  Atraumatic. Nose: No congestion/rhinnorhea. No erythema. EACs and TMs are clear bilaterally. Mouth/Throat: Mucous membranes are moist.  Oropharynx non-erythematous. Neck: No stridor.  Range of motion in all 4 planes without any restriction or without discomfort. Hematological/Lymphatic/Immunilogical: No cervical lymphadenopathy. Cardiovascular: Normal rate, regular rhythm. Grossly normal heart sounds.  Good peripheral circulation. Respiratory: Normal respiratory effort.  No retractions. Lungs CTAB. Gastrointestinal: Soft and nontender. No distention.  Musculoskeletal: No lower extremity tenderness nor edema.  No joint effusions. Neurologic:  Normal speech and language. No gross focal neurologic deficits are appreciated. No gait instability. Cranial nerves II through XII grossly intact. Skin:  Skin is warm, dry and intact. No rash noted. Psychiatric: Mood and affect are normal. Speech and behavior are normal.  ____________________________________________   LABS (all labs ordered are listed, but only abnormal results are displayed)  Labs Reviewed  POCT PREGNANCY, URINE  POC URINE PREG, ED    PROCEDURES  Procedure(s) performed: None  Critical Care performed: No  ____________________________________________   INITIAL IMPRESSION / ASSESSMENT AND PLAN / ED COURSE  Pertinent labs & imaging results that were available during my care of the patient were reviewed by me and considered in my medical decision making (see chart for details).  Patient improved on shot for her headache complaints. She is to continue taking her Fioricet that she has at home for her headache as directed by her doctor. She is to continue her anxiety and depression medicine as directed by her doctor and get more instructions from her doctor when she calls as the patient has already left a message with the answering service. ____________________________________________   FINAL CLINICAL IMPRESSION(S) / ED  DIAGNOSES  Final diagnoses:  Anxiety disorder, unspecified anxiety disorder type  Tension-type headache, not intractable, unspecified chronicity pattern      Tommi RumpsRhonda L Summers, PA-C 12/29/14 1234  Emily FilbertJonathan E Williams, MD 12/29/14 [phone]

## 2015-02-25 ENCOUNTER — Encounter: Payer: Self-pay | Admitting: *Deleted

## 2015-02-25 ENCOUNTER — Other Ambulatory Visit: Payer: Self-pay

## 2015-02-25 ENCOUNTER — Emergency Department: Payer: 59

## 2015-02-25 ENCOUNTER — Emergency Department
Admission: EM | Admit: 2015-02-25 | Discharge: 2015-02-25 | Disposition: A | Discharge status: Home / Self Care | Payer: 59 | Attending: Emergency Medicine | Admitting: Emergency Medicine

## 2015-02-25 DIAGNOSIS — Z79899 Other long term (current) drug therapy: Secondary | ICD-10-CM | POA: Diagnosis not present

## 2015-02-25 DIAGNOSIS — Y998 Other external cause status: Secondary | ICD-10-CM | POA: Insufficient documentation

## 2015-02-25 DIAGNOSIS — L500 Allergic urticaria: Secondary | ICD-10-CM | POA: Insufficient documentation

## 2015-02-25 DIAGNOSIS — Y9389 Activity, other specified: Secondary | ICD-10-CM | POA: Insufficient documentation

## 2015-02-25 DIAGNOSIS — Z880 Allergy status to penicillin: Secondary | ICD-10-CM | POA: Diagnosis not present

## 2015-02-25 DIAGNOSIS — X58XXXA Exposure to other specified factors, initial encounter: Secondary | ICD-10-CM | POA: Diagnosis not present

## 2015-02-25 DIAGNOSIS — T7840XA Allergy, unspecified, initial encounter: Secondary | ICD-10-CM | POA: Diagnosis not present

## 2015-02-25 DIAGNOSIS — Y9289 Other specified places as the place of occurrence of the external cause: Secondary | ICD-10-CM | POA: Diagnosis not present

## 2015-02-25 LAB — CBC
HEMATOCRIT: 38.3 % (ref 35.0–47.0)
HEMOGLOBIN: 12.6 g/dL (ref 12.0–16.0)
MCH: 27.4 pg (ref 26.0–34.0)
MCHC: 32.9 g/dL (ref 32.0–36.0)
MCV: 83.4 fL (ref 80.0–100.0)
Platelets: 252 10*3/uL (ref 150–440)
RBC: 4.6 MIL/uL (ref 3.80–5.20)
RDW: 13.2 % (ref 11.5–14.5)
WBC: 10 10*3/uL (ref 3.6–11.0)

## 2015-02-25 LAB — COMPREHENSIVE METABOLIC PANEL
ALBUMIN: 3.9 g/dL (ref 3.5–5.0)
ALK PHOS: 50 U/L (ref 38–126)
ALT: 16 U/L (ref 14–54)
ANION GAP: 7 (ref 5–15)
AST: 18 U/L (ref 15–41)
BILIRUBIN TOTAL: 0.5 mg/dL (ref 0.3–1.2)
BUN: 10 mg/dL (ref 6–20)
CALCIUM: 9 mg/dL (ref 8.9–10.3)
CO2: 25 mmol/L (ref 22–32)
Chloride: 106 mmol/L (ref 101–111)
Creatinine, Ser: 0.85 mg/dL (ref 0.44–1.00)
GFR calc Af Amer: >60 mL/min (ref >60)
GFR calc non Af Amer: >60 mL/min (ref >60)
GLUCOSE: 87 mg/dL (ref 65–99)
POTASSIUM: 3.7 mmol/L (ref 3.5–5.1)
SODIUM: 138 mmol/L (ref 135–145)
TOTAL PROTEIN: 7.8 g/dL (ref 6.5–8.1)

## 2015-02-25 LAB — TROPONIN I: Troponin I: 0.03 ng/mL (ref <0.031)

## 2015-02-25 MED ORDER — LORAZEPAM 2 MG/ML IJ SOLN
1.0000 mg | Freq: Once | INTRAMUSCULAR | Status: AC
  Administered 2015-02-25: 1 mg via INTRAVENOUS
  Filled 2015-02-25: qty 1

## 2015-02-25 MED ORDER — FAMOTIDINE IN NACL 20-0.9 MG/50ML-% IV SOLN
20.0000 mg | Freq: Once | INTRAVENOUS | Status: AC
  Administered 2015-02-25: 20 mg via INTRAVENOUS
  Filled 2015-02-25: qty 50

## 2015-02-25 MED ORDER — PREDNISONE 20 MG PO TABS: 40.0000 mg | ORAL_TABLET | Freq: Every day | ORAL | Status: DC

## 2015-02-25 MED ORDER — DIPHENHYDRAMINE HCL 50 MG/ML IJ SOLN
50.0000 mg | Freq: Once | INTRAMUSCULAR | Status: AC
  Administered 2015-02-25: 50 mg via INTRAVENOUS
  Filled 2015-02-25: qty 1

## 2015-02-25 MED ORDER — IPRATROPIUM-ALBUTEROL 0.5-2.5 (3) MG/3ML IN SOLN
3.0000 mL | Freq: Once | RESPIRATORY_TRACT | Status: AC
  Administered 2015-02-25: 3 mL via RESPIRATORY_TRACT
  Filled 2015-02-25: qty 3

## 2015-02-25 MED ORDER — SODIUM CHLORIDE 0.9 % IV BOLUS (SEPSIS)
1000.0000 mL | Freq: Once | INTRAVENOUS | Status: AC
  Administered 2015-02-25: 1000 mL via INTRAVENOUS

## 2015-02-25 MED ORDER — METHYLPREDNISOLONE SODIUM SUCC 125 MG IJ SOLR
125.0000 mg | Freq: Once | INTRAMUSCULAR | Status: AC
  Administered 2015-02-25: 125 mg via INTRAVENOUS
  Filled 2015-02-25: qty 2

## 2015-02-25 NOTE — ED Notes (Signed)
Pt reports allergic reaction, rash/hives present on assessment, reports SOB, some new difficulty breathing, swelling in toes, and joint pain

## 2015-02-25 NOTE — ED Notes (Signed)
Pt reports hives started to appear yesterday, pt reports joint pain, chest tightness and shortness of breath

## 2015-02-25 NOTE — Discharge Instructions (Signed)
You have been seen in the emergency department today for a likely allergic reaction. As we've discussed your workup has not shown any acute findings. Please take your prednisone for the next 5 days as prescribed (you will not need any until tomorrow 02/26/15). You may also take Benadryl 50 mg every 6-8 hours as needed for itching or hives. Please follow-up with her primary care physician one to 2 days for recheck/reevaluation. Return to the emergency department for any trouble breathing, or worsening symptoms.   Allergies An allergy is an abnormal reaction to a substance by the body's defense system (immune system). Allergies can develop at any age. WHAT CAUSES ALLERGIES? An allergic reaction happens when the immune system mistakenly reacts to a normally harmless substance, called an allergen, as if it were harmful. The immune system releases antibodies to fight the substance. Antibodies eventually release a chemical called histamine into the bloodstream. The release of histamine is meant to protect the body from infection, but it also causes discomfort. An allergic reaction can be triggered by:  Eating an allergen.  Inhaling an allergen.  Touching an allergen. WHAT TYPES OF ALLERGIES ARE THERE? There are many types of allergies. Common types include:  Seasonal allergies. People with this type of allergy are usually allergic to substances that are only present during certain seasons, such as molds and pollens.  Food allergies.  Drug allergies.  Insect allergies.  Animal dander allergies. WHAT ARE SYMPTOMS OF ALLERGIES? Possible allergy symptoms include:  Swelling of the lips, face, tongue, mouth, or throat.  Sneezing, coughing, or wheezing.  Nasal congestion.  Tingling in the mouth.  Rash.  Itching.  Itchy, red, swollen areas of skin (hives).  Watery eyes.  Vomiting.  Diarrhea.  Dizziness.  Lightheadedness.  Fainting.  Trouble breathing or swallowing.  Chest  tightness.  Rapid heartbeat. HOW ARE ALLERGIES DIAGNOSED? Allergies are diagnosed with a medical and family history and one or more of the following:  Skin tests.  Blood tests.  A food diary. A food diary is a record of all the foods and drinks you have in a day and of all the symptoms you experience.  The results of an elimination diet. An elimination diet involves eliminating foods from your diet and then adding them back in one by one to find out if a certain food causes an allergic reaction. HOW ARE ALLERGIES TREATED? There is no cure for allergies, but allergic reactions can be treated with medicine. Severe reactions usually need to be treated at a hospital. HOW CAN REACTIONS BE PREVENTED? The best way to prevent an allergic reaction is by avoiding the substance you are allergic to. Allergy shots and medicines can also help prevent reactions in some cases. People with severe allergic reactions may be able to prevent a life-threatening reaction called anaphylaxis with a medicine given right after exposure to the allergen.   This information is not intended to replace advice given to you by your health care provider. Make sure you discuss any questions you have with your health care provider.   Document Released: 04/05/2002 Document Revised: 01/31/2014 Document Reviewed: 10/22/2013 Elsevier Interactive Patient Education [employer]

## 2015-02-25 NOTE — ED Provider Notes (Signed)
[facility] Emergency Department Provider Note  Time seen: 12:30 PM  I have reviewed the triage vital signs and the nursing notes.   HISTORY  Chief Complaint Allergic Reaction    HPI Michelle Rowland is a 30 y.o. female with a past medical history of anxiety, gastric ulcers, migraines who presents the emergency department with allergic reaction symptoms. According to the patient beginning yesterday she noticed hives and itching across her arms chest and back. States the symptoms have worsened today and she began feeling a little short of breath so she came to the emergency department for evaluation. The patient took Benadryl at home without relief this morning. Denies any nausea or vomiting. Patient states she is only had one allergic reaction in the past which was due to [facility]. Denies any recent antibiotic usage. Denies any new medications, foods, bathing products, detergents, etc.describes her shortness of breath is minimal.    Past Medical History  Diagnosis Date  . Multiple gastric ulcers   . Depression   . Anxiety   . Anxiety   . Migraines     There are no active problems to display for this patient.   Past Surgical History  Procedure Laterality Date  . Tonsillectomy      Current Outpatient Rx  Name  Route  Sig  Dispense  Refill  . benzonatate (TESSALON) 100 MG capsule   Oral   Take 100 mg by mouth every 8 (eight) hours.          . chlorpheniramine-HYDROcodone (TUSSIONEX PENNKINETIC ER) 10-8 MG/5ML SUER   Oral   Take 5 mLs by mouth 2 (two) times daily.   140 mL   0   . clonazePAM (KLONOPIN) 0.5 MG disintegrating tablet   Oral   Take 0.5 mg by mouth 3 (three) times daily as needed. For anxiety      1   . naproxen (NAPROSYN) 250 MG tablet   Oral   Take 1 tablet (250 mg total) by mouth 2 (two) times daily with a meal.   30 tablet   0   . Norethindrone Acetate-Ethinyl Estrad-FE (LOESTRIN 24 FE) 1-20 MG-MCG(24) tablet   Oral  Take 1 tablet by mouth daily.      9   . ondansetron (ZOFRAN) 4 MG tablet   Oral   Take 1 tablet (4 mg total) by mouth daily as needed for nausea or vomiting.   10 tablet   1   . pantoprazole (PROTONIX) 40 MG tablet   Oral   Take 1 tablet (40 mg total) by mouth daily.   30 tablet   1   . predniSONE (DELTASONE) 50 MG tablet      One tablet by mouth daily   4 tablet   0   . venlafaxine XR (EFFEXOR-XR) 75 MG 24 hr capsule   Oral   Take 75 mg by mouth daily.           Allergies Amoxicillin; Cephalosporins; and Penicillins  No family history on file.  Social History Social History  Substance Use Topics  . Smoking status: Never Smoker   . Smokeless tobacco: None  . Alcohol Use: No    Review of Systems Constitutional: Negative for fever Cardiovascular: Negative for chest pain. Respiratory: Mild shortness of breath. Gastrointestinal: Negative for abdominal pain Skin: Hives, sparse but diffuse over her entire body. Neurological: Negative for headache 10-point ROS otherwise negative.  ____________________________________________   PHYSICAL EXAM:  VITAL SIGNS: ED Triage Vitals  Enc  Vitals Group     BP 02/25/15 1141 131/91 mmHg     Pulse Rate 02/25/15 1141 103     Resp 02/25/15 1141 20     Temp 02/25/15 1141 98.1 F (36.7 C)     Temp Source 02/25/15 1141 Oral     SpO2 --      Weight --      Height --      Head Cir --      Peak Flow --      Pain Score 02/25/15 1141 8     Pain Loc --      Pain Edu? --      Excl. in GC? --     Constitutional: Alert and oriented. Well appearing and in no distress. Eyes: Normal exam ENT   Head: Normocephalic and atraumatic.   Mouth/Throat: Mucous membranes are moist. No oral or pharyngeal edema. Cardiovascular: Normal rate, regular rhythm. No murmur Respiratory: Normal respiratory effort without tachypnea nor retractions. Breath sounds are clear and equal bilaterally. No wheezes/rales/rhonchi. Gastrointestinal:  Soft and nontender. No distention.   Musculoskeletal: Nontender with normal range of motion in all extremities. Neurologic:  Normal speech and language. No gross focal neurologic deficits Skin:  Patient has sparse but diffuse hives over her upper, lower extremities, chest and back. Psychiatric: Mood and affect are normal. Speech and behavior are normal.  ____________________________________________    EKG  EKG reviewed and interpreted by myself shows normal sinus rhythm at 95 bpm, narrow QRS, normal axis, normal intervals, nonspecific but no concerning ST changes. Overall reassuring EKG.  INITIAL IMPRESSION / ASSESSMENT AND PLAN / ED COURSE  Pertinent labs & imaging results that were available during my care of the patient were reviewed by me and considered in my medical decision making (see chart for details).  Patient presents with diffuse hives, itching, with mild shortness breath. Patient's exam is consistent with urticaria. No wheeze, no pharyngeal or oral edema. It is unclear the cause of the allergic reaction. We will check basic labs, treat with Solu-Medrol, Benadryl, Pepcid, normal saline and closely monitor in the emergency department.  Urticaria is resolved. Patient states she continues to feel mild chest tightness, x-ray normal, labs normal, received a DuoNeb, but states it didn't really help. Patient does receive Ativan and states that his made her tired which is allowed her to sleep, which she is painful for her. At this time patient looks very well, no distress, normal vitals, normal labs, no further urticaria, no wheeze, we'll discharge home with prednisone for 5 days and have the patient follow up with her primary care physician.  ____________________________________________   FINAL CLINICAL IMPRESSION(S) / ED DIAGNOSES  Allergic reaction   Minna Antis, MD 02/25/15 1452

## 2015-03-03 ENCOUNTER — Encounter: Payer: Self-pay | Admitting: Emergency Medicine

## 2015-03-03 ENCOUNTER — Emergency Department
Admission: EM | Admit: 2015-03-03 | Discharge: 2015-03-03 | Disposition: A | Discharge status: Home / Self Care | Payer: 59 | Attending: Emergency Medicine | Admitting: Emergency Medicine

## 2015-03-03 DIAGNOSIS — R21 Rash and other nonspecific skin eruption: Secondary | ICD-10-CM | POA: Diagnosis present

## 2015-03-03 DIAGNOSIS — Z791 Long term (current) use of non-steroidal anti-inflammatories (NSAID): Secondary | ICD-10-CM | POA: Diagnosis not present

## 2015-03-03 DIAGNOSIS — L299 Pruritus, unspecified: Secondary | ICD-10-CM | POA: Diagnosis not present

## 2015-03-03 DIAGNOSIS — Z880 Allergy status to penicillin: Secondary | ICD-10-CM | POA: Insufficient documentation

## 2015-03-03 DIAGNOSIS — L509 Urticaria, unspecified: Secondary | ICD-10-CM | POA: Insufficient documentation

## 2015-03-03 DIAGNOSIS — Z79899 Other long term (current) drug therapy: Secondary | ICD-10-CM | POA: Diagnosis not present

## 2015-03-03 DIAGNOSIS — L508 Other urticaria: Secondary | ICD-10-CM

## 2015-03-03 MED ORDER — CETIRIZINE HCL 10 MG PO CAPS: 10.0000 mg | ORAL_CAPSULE | Freq: Once | ORAL | Status: DC

## 2015-03-03 MED ORDER — DIPHENHYDRAMINE HCL 50 MG/ML IJ SOLN
INTRAMUSCULAR | Status: AC
  Filled 2015-03-03: qty 1

## 2015-03-03 MED ORDER — RANITIDINE HCL 150 MG PO TABS: 150.0000 mg | ORAL_TABLET | Freq: Two times a day (BID) | ORAL | Status: DC

## 2015-03-03 MED ORDER — PREDNISONE 10 MG PO TABS: ORAL_TABLET | ORAL | Status: DC

## 2015-03-03 MED ORDER — DIPHENHYDRAMINE HCL 50 MG/ML IJ SOLN
25.0000 mg | Freq: Once | INTRAMUSCULAR | Status: AC
  Administered 2015-03-03: 25 mg via INTRAVENOUS

## 2015-03-03 MED ORDER — FAMOTIDINE IN NACL 20-0.9 MG/50ML-% IV SOLN
20.0000 mg | Freq: Once | INTRAVENOUS | Status: AC
  Administered 2015-03-03: 20 mg via INTRAVENOUS
  Filled 2015-03-03: qty 50

## 2015-03-03 MED ORDER — DEXAMETHASONE SODIUM PHOSPHATE 10 MG/ML IJ SOLN
10.0000 mg | Freq: Once | INTRAMUSCULAR | Status: AC
  Administered 2015-03-03: 10 mg via INTRAVENOUS
  Filled 2015-03-03: qty 1

## 2015-03-03 MED ORDER — EPINEPHRINE HCL 1 MG/ML IJ SOLN
INTRAMUSCULAR | Status: AC
  Filled 2015-03-03: qty 1

## 2015-03-03 MED ORDER — EPINEPHRINE 0.3 MG/0.3ML IJ SOAJ
0.3000 mg | Freq: Once | INTRAMUSCULAR | Status: DC
  Filled 2015-03-03: qty 0.3

## 2015-03-03 MED ORDER — EPINEPHRINE 0.3 MG/0.3ML IJ SOAJ
0.3000 mg | Freq: Once | INTRAMUSCULAR | Status: AC
  Administered 2015-03-03: 0.3 mg via SUBCUTANEOUS

## 2015-03-03 NOTE — ED Notes (Signed)
States she developed hives/rash with itching last week.. Was then started on septra about 3 days later   conts to have the hives/itching  And now feels like throat is closing   No resp distress noted

## 2015-03-03 NOTE — Discharge Instructions (Signed)
Hives Hives are itchy, red, puffy (swollen) areas of the skin. Hives can change in size and location on your body. Hives can come and go for hours, days, or weeks. Hives do not spread from person to person (noncontagious). Scratching, exercise, and stress can make your hives worse. HOME CARE  Avoid things that cause your hives (triggers).  Take antihistamine medicines as told by your doctor. Do not drive while taking an antihistamine.  Take any other medicines for itching as told by your doctor.  Wear loose-fitting clothing.  Keep all doctor visits as told. GET HELP RIGHT AWAY IF:   You have a fever.  Your tongue or lips are puffy.  You have trouble breathing or swallowing.  You feel tightness in the throat or chest.  You have belly (abdominal) pain.  You have lasting or severe itching that is not helped by medicine.  You have painful or puffy joints. These problems may be the first sign of a life-threatening allergic reaction. Call your local emergency services (911 in [place] MAKE SURE YOU:   Understand these instructions.  Will watch your condition.  Will get help right away if you are not doing well or get worse.   This information is not intended to replace advice given to you by your health care provider. Make sure you discuss any questions you have with your health care provider.   Document Released: 10/20/2007 Document Revised: 07/12/2011 Document Reviewed: 04/05/2011 Elsevier Interactive Patient Education 2016 [employer]   See Dr. Juanetta Gosling at [facility] this week. Zyrtec 10 mg once daily. Continue Benadryl 50 mg every 6 hours. Zantac 150 mg 1 twice a day. Prednisone as directed.  Take Epi-pen everywhere you go to be used if any allergic reaction. Return to the emergency room if any urgent concerns.

## 2015-03-03 NOTE — ED Provider Notes (Signed)
[facility] Emergency Department Provider Note  ____________________________________________  Time seen: Approximately 11:22 AM  I have reviewed the triage vital signs and the nursing notes.   HISTORY  Chief Complaint Urticaria  HPI Michelle Rowland is a 30 y.o. female fear complaint of hives/rash that is been itching for the last week. Patient was started on Septra and 3 days later began itching. Patient was seen in the emergency room on 02/25/15 for the same. At that time she was treated with Solu-Medrol, Benadryl, Pepcid and monitored in the emergency room. Patient improved and was discharged on 5 days of prednisone which she states she did take. She states she finished the antibiotic last evening which she believes is a sulfa drug. She states that she has had allergy problems in the past and has been hospitalized for hives. She has never seen an allergy specialist in the past. Currently she denies any difficulty breathing but is very anxious. She has been drinking and eating with a difficulty.   Past Medical History  Diagnosis Date  . Multiple gastric ulcers   . Depression   . Anxiety   . Anxiety   . Migraines     There are no active problems to display for this patient.   Past Surgical History  Procedure Laterality Date  . Tonsillectomy      Current Outpatient Rx  Name  Route  Sig  Dispense  Refill  . benzonatate (TESSALON) 100 MG capsule   Oral   Take 100 mg by mouth every 8 (eight) hours.          Marland Kitchen buPROPion (WELLBUTRIN XL) 150 MG 24 hr tablet   Oral   Take 150 mg by mouth daily.         . Cetirizine HCl 10 MG CAPS   Oral   Take 1 capsule (10 mg total) by mouth once.   30 capsule   1   . chlorpheniramine-HYDROcodone (TUSSIONEX PENNKINETIC ER) 10-8 MG/5ML SUER   Oral   Take 5 mLs by mouth 2 (two) times daily.   140 mL   0   . clonazePAM (KLONOPIN) 0.5 MG disintegrating tablet   Oral   Take 0.5 mg by mouth 3 (three) times daily  as needed. For anxiety      1   . diphenhydrAMINE (BENADRYL) 25 MG tablet   Oral   Take 25 mg by mouth every 6 (six) hours as needed.         Marland Kitchen EPIPEN 2-PAK 0.3 MG/0.3ML SOAJ injection      as needed.           Dispense as written.   . naproxen (NAPROSYN) 250 MG tablet   Oral   Take 1 tablet (250 mg total) by mouth 2 (two) times daily with a meal.   30 tablet   0   . Norethindrone Acetate-Ethinyl Estrad-FE (LOESTRIN 24 FE) 1-20 MG-MCG(24) tablet   Oral   Take 1 tablet by mouth daily.      9   . ondansetron (ZOFRAN) 4 MG tablet   Oral   Take 1 tablet (4 mg total) by mouth daily as needed for nausea or vomiting.   10 tablet   1   . predniSONE (DELTASONE) 10 MG tablet      Take 3 tablets once a day for 5 days   9 tablet   0   . QUEtiapine (SEROQUEL) 100 MG tablet   Oral   Take 1 tablet by  mouth at bedtime.         . ranitidine (ZANTAC) 150 MG tablet   Oral   Take 1 tablet (150 mg total) by mouth 2 (two) times daily.   60 tablet   1   . venlafaxine XR (EFFEXOR-XR) 75 MG 24 hr capsule   Oral   Take 75 mg by mouth daily.           Allergies Amoxicillin; Cephalosporins; and Penicillins  History reviewed. No pertinent family history.  Social History Social History  Substance Use Topics  . Smoking status: Never Smoker   . Smokeless tobacco: None  . Alcohol Use: No    Review of Systems Constitutional: No fever/chills ENT: No sore throat. Cardiovascular: Denies chest pain. Respiratory: Denies shortness of breath. Gastrointestinal: No abdominal pain.  No nausea, no vomiting.   Musculoskeletal: Negative for back pain. Skin: Positive for rash Neurological: Negative for headaches, focal weakness or numbness.  10-point ROS otherwise negative.  ____________________________________________   PHYSICAL EXAM:  VITAL SIGNS: ED Triage Vitals  Enc Vitals Group     BP 03/03/15 1032 123/80 mmHg     Pulse Rate 03/03/15 1032 102     Resp 03/03/15  1032 18     Temp 03/03/15 1032 98 F (36.7 C)     Temp Source 03/03/15 1032 Oral     SpO2 03/03/15 1032 100 %     Weight 03/03/15 1032 230 lb (104.327 kg)     Height 03/03/15 1032  (1.626 m)     Head Cir --      Peak Flow --      Pain Score 03/03/15 1033 0     Pain Loc --      Pain Edu? --      Excl. in GC? --     Constitutional: Alert and oriented. Well appearing and in no acute distress. Eyes: Conjunctivae are normal. PERRL. EOMI. Head: Atraumatic. Nose: No congestion/rhinnorhea. EACs and TMs are clear bilaterally. Mouth/Throat: Mucous membranes are moist.  Oropharynx non-erythematous. No edema is noted in the oropharynx. Patient is swallowing saliva without any difficulty. Neck: No stridor.   Hematological/Lymphatic/Immunilogical: No cervical lymphadenopathy. Cardiovascular: Normal rate, regular rhythm. Grossly normal heart sounds.  Good peripheral circulation. Respiratory: Normal respiratory effort.  No retractions. Lungs CTAB. There is absolutely no wheezing noted. Gastrointestinal: Soft and nontender. No distention. Musculoskeletal: No lower extremity tenderness nor edema.  No joint effusions. Neurologic:  Normal speech and language. No gross focal neurologic deficits are appreciated. No gait instability. Skin:  Skin is warm, dry and intact. Patient has an erythematous rash that was initially on her abdomen and thighs. Patient states that her skin is itching all over and she has been scratching quite a bit. Psychiatric: Mood and affect are normal. Speech and behavior are normal.  ____________________________________________   LABS (all labs ordered are listed, but only abnormal results are displayed)  Labs Reviewed - No data to display  PROCEDURES  Procedure(s) performed: None  Critical Care performed: No  ____________________________________________   INITIAL IMPRESSION / ASSESSMENT AND PLAN / ED COURSE  Pertinent labs & imaging results that were available  during my care of the patient were reviewed by me and considered in my medical decision making (see chart for details).  Patient was observed for several hours after being given Decadron, Pepcid, Benadryl, and epinephrine 0.3 mg subcutaneous. Patient hives dissipated and did not return. Patient had no complaints. Prior to discharge lungs she was still not wheezing nor  did she have any oral edema or difficulty with swallowing or talking. Patient currently has an EpiPen with her and will obtain Zyrtec, Zantac 50 mg twice a day and 30 mg of prednisone daily for 5 days. Patient is to contact her doctor and get a referral to an allergist. Patient is to return to the emergency room if any severe worsening of her symptoms. We have only discussed that should she continue having these problems she will need to see a allergy specialist to determine what the source of her allergy is. He is return to the emergency room if any urgent concerns or worsening of her symptoms. ____________________________________________   FINAL CLINICAL IMPRESSION(S) / ED DIAGNOSES  Final diagnoses:  Urticaria, acute      Tommi Rumps, PA-C 03/03/15 1725  Minna Antis, MD 03/04/15 2258

## 2015-03-03 NOTE — ED Notes (Signed)
Reports hives, itching, and sob since last Tuesday after starting sulfa antibiotic for uti.  No resp distress

## 2015-04-03 ENCOUNTER — Emergency Department
Admission: EM | Admit: 2015-04-03 | Discharge: 2015-04-03 | Disposition: A | Discharge status: Home / Self Care | Payer: 59 | Attending: Emergency Medicine | Admitting: Emergency Medicine

## 2015-04-03 ENCOUNTER — Encounter: Payer: Self-pay | Admitting: Emergency Medicine

## 2015-04-03 DIAGNOSIS — Z79899 Other long term (current) drug therapy: Secondary | ICD-10-CM | POA: Diagnosis not present

## 2015-04-03 DIAGNOSIS — T7840XA Allergy, unspecified, initial encounter: Secondary | ICD-10-CM | POA: Diagnosis present

## 2015-04-03 DIAGNOSIS — G43909 Migraine, unspecified, not intractable, without status migrainosus: Secondary | ICD-10-CM | POA: Insufficient documentation

## 2015-04-03 DIAGNOSIS — F419 Anxiety disorder, unspecified: Secondary | ICD-10-CM | POA: Insufficient documentation

## 2015-04-03 DIAGNOSIS — F329 Major depressive disorder, single episode, unspecified: Secondary | ICD-10-CM | POA: Diagnosis not present

## 2015-04-03 MED ORDER — PREDNISONE 20 MG PO TABS
50.0000 mg | ORAL_TABLET | Freq: Once | ORAL | Status: AC
  Administered 2015-04-03: 50 mg via ORAL
  Filled 2015-04-03: qty 2

## 2015-04-03 MED ORDER — PREDNISONE 10 MG PO TABS: ORAL_TABLET | ORAL | Status: DC

## 2015-04-03 NOTE — ED Notes (Signed)
Reports finishing an antibiotic on Wednesday.  Then started having itching and now having swelling.  No resp distress. States she took benedryl at home and has had a simular reaction to an antibiotic in the past.

## 2015-04-03 NOTE — ED Provider Notes (Signed)
[facility] Emergency Department Provider Note   ____________________________________________  Time seen:  I have reviewed the triage vital signs and the triage nursing note.  HISTORY  Chief Complaint Allergic Reaction   Historian Patient  HPI Michelle Rowland is a 30 y.o. female who is here complaining of hives to her extremities with itching which started yesterday. She's complaining of swelling to her hands with some mild redness on her hands. She just finished a course of minocycline, one day before the rash started. She's taken Benadryl and that has improved the rash although the swelling and redness to the hand are not completely gone. She said that she thought she was having a little cough earlier, but right now her swelling is fine. No facial swelling.  She did have a recent drug allergy to what was felt to be a third generation cephalosporin.  Patient reports that she has a fair amount of anxiety about concern about additional allergic reactions.  She does have an appointment set up with an allergist soon.    Past Medical History  Diagnosis Date  . Multiple gastric ulcers   . Depression   . Anxiety   . Anxiety   . Migraines     There are no active problems to display for this patient.   Past Surgical History  Procedure Laterality Date  . Tonsillectomy      Current Outpatient Rx  Name  Route  Sig  Dispense  Refill  . benzonatate (TESSALON) 100 MG capsule   Oral   Take 100 mg by mouth every 8 (eight) hours.          Marland Kitchen buPROPion (WELLBUTRIN XL) 150 MG 24 hr tablet   Oral   Take 150 mg by mouth daily.         . Cetirizine HCl 10 MG CAPS   Oral   Take 1 capsule (10 mg total) by mouth once.   30 capsule   1   . chlorpheniramine-HYDROcodone (TUSSIONEX PENNKINETIC ER) 10-8 MG/5ML SUER   Oral   Take 5 mLs by mouth 2 (two) times daily.   140 mL   0   . clonazePAM (KLONOPIN) 0.5 MG disintegrating tablet   Oral   Take 0.5 mg  by mouth 3 (three) times daily as needed. For anxiety      1   . diphenhydrAMINE (BENADRYL) 25 MG tablet   Oral   Take 25 mg by mouth every 6 (six) hours as needed.         Marland Kitchen EPIPEN 2-PAK 0.3 MG/0.3ML SOAJ injection      as needed.           Dispense as written.   . naproxen (NAPROSYN) 250 MG tablet   Oral   Take 1 tablet (250 mg total) by mouth 2 (two) times daily with a meal.   30 tablet   0   . Norethindrone Acetate-Ethinyl Estrad-FE (LOESTRIN 24 FE) 1-20 MG-MCG(24) tablet   Oral   Take 1 tablet by mouth daily.      9   . ondansetron (ZOFRAN) 4 MG tablet   Oral   Take 1 tablet (4 mg total) by mouth daily as needed for nausea or vomiting.   10 tablet   1   . predniSONE (DELTASONE) 10 MG tablet      Take  tomorrow. Then take  for two days Then take  for two days Then take  for two days Then take  for two  days   25 tablet   0   . QUEtiapine (SEROQUEL) 100 MG tablet   Oral   Take 1 tablet by mouth at bedtime.         . ranitidine (ZANTAC) 150 MG tablet   Oral   Take 1 tablet (150 mg total) by mouth 2 (two) times daily.   60 tablet   1   . venlafaxine XR (EFFEXOR-XR) 75 MG 24 hr capsule   Oral   Take 75 mg by mouth daily.           Allergies Amoxicillin; Cephalosporins; and Penicillins  History reviewed. No pertinent family history.  Social History Social History  Substance Use Topics  . Smoking status: Never Smoker   . Smokeless tobacco: None  . Alcohol Use: No    Review of Systems  Constitutional: Negative for fever. Eyes: Negative for visual changes. ENT: Negative for sore throat. Cardiovascular: Negative for chest pain. Respiratory: Negative for shortness of breath. Gastrointestinal: Negative for abdominal pain, vomiting and diarrhea. Genitourinary: Negative for dysuria. Musculoskeletal: Negative for back pain. Skin: Positive for skin rash Neurological: Negative for headache. 10 point Review of Systems  otherwise negative ____________________________________________   PHYSICAL EXAM:  VITAL SIGNS: ED Triage Vitals  Enc Vitals Group     BP 04/03/15 1351 121/79 mmHg     Pulse Rate 04/03/15 1351 86     Resp 04/03/15 1351 18     Temp 04/03/15 1351 97.7 F (36.5 C)     Temp Source 04/03/15 1351 Oral     SpO2 04/03/15 1351 100 %     Weight 04/03/15 1351 230 lb (104.327 kg)     Height 04/03/15 1351 5\' 4"  (1.626 m)     Head Cir --      Peak Flow --      Pain Score 04/03/15 1351 0     Pain Loc --      Pain Edu? --      Excl. in GC? --      Constitutional: Alert and oriented. Well appearing and in no distress. HEENT   Head: Normocephalic and atraumatic.      Eyes: Conjunctivae are normal. PERRL. Normal extraocular movements.      Ears:         Nose: No congestion/rhinnorhea.   Mouth/Throat: Mucous membranes are moist. No facial or pharyngeal swelling   Neck: No stridor. Cardiovascular/Chest: Normal rate, regular rhythm.  No murmurs, rubs, or gallops. Respiratory: Normal respiratory effort without tachypnea nor retractions. Breath sounds are clear and equal bilaterally. No wheezes/rales/rhonchi. Gastrointestinal: Soft. No distention, no guarding, no rebound. Nontender.    Genitourinary/rectal:Deferred Musculoskeletal: Nontender with normal range of motion in all extremities. No joint effusions.  No lower extremity tenderness.  Very mild edema in the both hands. Mild erythema to her hands, look like old hives. Neurologic:  Normal speech and language. No gross or focal neurologic deficits are appreciated. Skin:  Skin is warm, dry and intact. Old hives, resolving on the hand. Psychiatric: Mood and affect are normal. Speech and behavior are normal. Patient exhibits appropriate insight and judgment.  ____________________________________________   EKG I, Governor Rooksebecca Maxi Carreras, MD, the attending physician have personally viewed and interpreted all  ECGs.  None ____________________________________________  LABS (pertinent positives/negatives)  None  ____________________________________________  RADIOLOGY All Xrays were viewed by me. Imaging interpreted by Radiologist.  None __________________________________________  PROCEDURES  Procedure(s) performed: None  Critical Care performed: None  ____________________________________________   ED COURSE / ASSESSMENT AND PLAN  Pertinent labs & imaging results that were available during my care of the patient were reviewed by me and considered in my medical decision making (see chart for details).   This patient right now has resolving symptoms related to an allergic reaction, uncertain cause, but could be the recent new medication minocycline that she just completed.  No respiratory or airway issues at present.  I did discuss with the patient observing her for several hours, however she states that she wants to go home now, and has an EpiPen, and understands how to use it. She will be discharged home with a course of prednisone, and Benadryl. She has an allergist appointment coming up.    CONSULTATIONS:   None   Patient / Family / Caregiver informed of clinical course, medical decision-making process, and agree with plan.   I discussed return precautions, follow-up instructions, and discharged instructions with patient and/or family.   ___________________________________________   FINAL CLINICAL IMPRESSION(S) / ED DIAGNOSES   Final diagnoses:  Allergic reaction, initial encounter              Note: This dictation was prepared with Dragon dictation. Any transcriptional errors that result from this process are unintentional   Governor Rooks, MD 04/03/15 1554

## 2015-04-03 NOTE — Discharge Instructions (Signed)
You were evaluated for skin rash and itching, concerning for an allergic reaction to unknown trigger, but possibly the minocycline antibiotic.  Do not take minocycline antibiotic.  Return to the emergency room for any worsening condition including worsening itching, rash, or certainly any trouble breathing, wheezing, throat swelling, facial swelling, or any other symptoms concerning to you including passing out.  Drug Allergy Allergic reactions to medicines are common. Some allergic reactions are mild. A delayed type of drug allergy that occurs 1 week or more after exposure to a medicine or vaccine is called serum sickness. A life-threatening, sudden (acute) allergic reaction that involves the whole body is called anaphylaxis. CAUSES  "True" drug allergies occur when there is an allergic reaction to a medicine. This is caused by overactivity of the immune system. First, the body becomes sensitized. The immune system is triggered by your first exposure to the medicine. Following this first exposure, future exposure to the same medicine may be life-threatening. Almost any medicine can cause an allergic reaction. Common ones are:  Penicillin.  Sulfonamides (sulfa drugs).  Local anesthetics.  X-ray dyes that contain iodine. SYMPTOMS  Common symptoms of a minor allergic reaction are:  Swelling around the mouth.  An itchy red rash or hives.  Vomiting or diarrhea. Anaphylaxis can cause swelling of the mouth and throat. This makes it difficult to breathe and swallow. Severe reactions can be fatal within seconds, even after exposure to only a trace amount of the drug that causes the reaction. HOME CARE INSTRUCTIONS  If you are unsure of what caused your reaction, write down:  The names of the medicines you took.  How much medicine you took.  How you took the medicine, such as whether you took a pill, injected the medicine, or applied it to your skin.  All of the things you ate and  drank.  The date and time of your reaction.  The symptoms of the reaction.  You may want to follow up with an allergy specialist after the reaction has cleared in order to be tested to confirm the allergy. It is important to confirm that your reaction is an allergy, not just a side effect to the medicine. If you have a true allergy to a medicine, this may prevent that medicine and related medicines from being given to you when you are very ill.  If you have hives or a rash:  Take medicines as directed by your caregiver.  You may use an over-the-counter antihistamine (diphenhydramine) as needed.  Apply cold compresses to the skin or take baths in cool water. Avoid hot baths or showers.  If you are severely allergic:  Continuous observation after a severe reaction may be needed. Hospitalization is often required.  Wear a medical alert bracelet or necklace stating your allergy.  You and your family must learn how to use an anaphylaxis kit or give an epinephrine injection to temporarily treat an emergency allergic reaction. If you have had a severe reaction, always carry your epinephrine injection or anaphylaxis kit with you. This can be lifesaving if you have a severe reaction.  Do not drive or perform tasks after treatment until the medicines used to treat your reaction have worn off, or until your caregiver says it is okay.  If you have a drug allergy that was confirmed by your health care provider:  Carry information about the drug allergy with you at all times.  Always check with a pharmacist before taking any over-the-counter medicine. SEEK MEDICAL CARE IF:  You think you had an allergic reaction. Symptoms usually start within 30 minutes after exposure.  Symptoms are getting worse rather than better.  You develop new symptoms.  The symptoms that brought you to your caregiver return. SEEK IMMEDIATE MEDICAL CARE IF:   You have swelling of the mouth, difficulty breathing, or  wheezing.  You have a tight feeling in your chest or throat.  You develop hives, swelling, or itching all over your body.  You develop severe vomiting or diarrhea.  You feel faint or pass out. This is an emergency. Use your epinephrine injection or anaphylaxis kit as you have been instructed. Call for emergency medical help. Even if you improve after the injection, you need to be examined at a hospital emergency department. MAKE SURE YOU:   Understand these instructions.  Will watch your condition.  Will get help right away if you are not doing well or get worse.   This information is not intended to replace advice given to you by your health care provider. Make sure you discuss any questions you have with your health care provider.   Document Released: 01/10/2005 Document Revised: 01/31/2014 Document Reviewed: 08/12/2014 Elsevier Interactive Patient Education [employer].

## 2015-04-03 NOTE — ED Notes (Signed)
Patient c/o generalized itching and swelling in her joints. Patient states that she just finished a course of Minocycline yesterday and thinks she may be having a reaction to this. Patient states that she was broke out in hives before coming to the ER but she took Benadryl at home and this helped with the hives. Patient is also c/o joint pain all over.

## 2015-07-23 ENCOUNTER — Encounter: Payer: Self-pay | Admitting: Emergency Medicine

## 2015-07-23 ENCOUNTER — Emergency Department
Admission: EM | Admit: 2015-07-23 | Discharge: 2015-07-24 | Disposition: A | Discharge status: Home / Self Care | Payer: BLUE CROSS/BLUE SHIELD | Attending: Emergency Medicine | Admitting: Emergency Medicine

## 2015-07-23 DIAGNOSIS — Z79899 Other long term (current) drug therapy: Secondary | ICD-10-CM | POA: Insufficient documentation

## 2015-07-23 DIAGNOSIS — Z791 Long term (current) use of non-steroidal anti-inflammatories (NSAID): Secondary | ICD-10-CM | POA: Insufficient documentation

## 2015-07-23 DIAGNOSIS — R109 Unspecified abdominal pain: Secondary | ICD-10-CM

## 2015-07-23 DIAGNOSIS — F329 Major depressive disorder, single episode, unspecified: Secondary | ICD-10-CM | POA: Diagnosis not present

## 2015-07-23 DIAGNOSIS — R102 Pelvic and perineal pain: Secondary | ICD-10-CM | POA: Diagnosis present

## 2015-07-23 DIAGNOSIS — R1031 Right lower quadrant pain: Secondary | ICD-10-CM | POA: Diagnosis not present

## 2015-07-23 DIAGNOSIS — Z7952 Long term (current) use of systemic steroids: Secondary | ICD-10-CM | POA: Insufficient documentation

## 2015-07-23 LAB — URINALYSIS COMPLETE WITH MICROSCOPIC (ARMC ONLY)
BACTERIA UA: NONE SEEN
Bilirubin Urine: NEGATIVE
GLUCOSE, UA: NEGATIVE mg/dL
Hgb urine dipstick: NEGATIVE
Ketones, ur: NEGATIVE mg/dL
NITRITE: NEGATIVE
PROTEIN: NEGATIVE mg/dL
SPECIFIC GRAVITY, URINE: 1.025 (ref 1.005–1.030)
pH: 5 (ref 5.0–8.0)

## 2015-07-23 LAB — COMPREHENSIVE METABOLIC PANEL
ALBUMIN: 4.2 g/dL (ref 3.5–5.0)
ALK PHOS: 53 U/L (ref 38–126)
ALT: 29 U/L (ref 14–54)
ANION GAP: 6 (ref 5–15)
AST: 28 U/L (ref 15–41)
BUN: 12 mg/dL (ref 6–20)
CALCIUM: 8.9 mg/dL (ref 8.9–10.3)
CHLORIDE: 102 mmol/L (ref 101–111)
CO2: 29 mmol/L (ref 22–32)
CREATININE: 0.79 mg/dL (ref 0.44–1.00)
GFR calc non Af Amer: >60 mL/min (ref >60)
GLUCOSE: 91 mg/dL (ref 65–99)
Potassium: 3.7 mmol/L (ref 3.5–5.1)
SODIUM: 137 mmol/L (ref 135–145)
Total Bilirubin: 0.5 mg/dL (ref 0.3–1.2)
Total Protein: 7.6 g/dL (ref 6.5–8.1)

## 2015-07-23 LAB — CBC WITH DIFFERENTIAL/PLATELET
BASOS PCT: 4 %
Basophils Absolute: 0.3 10*3/uL — ABNORMAL HIGH (ref 0–0.1)
EOS ABS: 0.1 10*3/uL (ref 0–0.7)
EOS PCT: 1 %
HCT: 36.1 % (ref 35.0–47.0)
HEMOGLOBIN: 12.6 g/dL (ref 12.0–16.0)
Lymphocytes Relative: 17 %
Lymphs Abs: 1.5 10*3/uL (ref 1.0–3.6)
MCH: 28.4 pg (ref 26.0–34.0)
MCHC: 34.9 g/dL (ref 32.0–36.0)
MCV: 81.4 fL (ref 80.0–100.0)
Monocytes Absolute: 0.7 10*3/uL (ref 0.2–0.9)
Monocytes Relative: 8 %
NEUTROS PCT: 70 %
Neutro Abs: 6.1 10*3/uL (ref 1.4–6.5)
PLATELETS: 246 10*3/uL (ref 150–440)
RBC: 4.44 MIL/uL (ref 3.80–5.20)
RDW: 13.1 % (ref 11.5–14.5)
WBC: 8.6 10*3/uL (ref 3.6–11.0)

## 2015-07-23 LAB — POCT PREGNANCY, URINE: PREG TEST UR: NEGATIVE

## 2015-07-23 LAB — LIPASE, BLOOD: Lipase: 28 U/L (ref 11–51)

## 2015-07-23 MED ORDER — MORPHINE SULFATE (PF) 4 MG/ML IV SOLN
4.0000 mg | Freq: Once | INTRAVENOUS | Status: AC
  Administered 2015-07-23: 4 mg via INTRAVENOUS

## 2015-07-23 MED ORDER — DICYCLOMINE HCL 20 MG PO TABS: 20.0000 mg | ORAL_TABLET | Freq: Three times a day (TID) | ORAL | Status: DC | PRN

## 2015-07-23 MED ORDER — MORPHINE SULFATE (PF) 4 MG/ML IV SOLN
INTRAVENOUS | Status: AC
  Administered 2015-07-23: 4 mg via INTRAVENOUS
  Filled 2015-07-23: qty 1

## 2015-07-23 MED ORDER — ONDANSETRON HCL 4 MG/2ML IJ SOLN
4.0000 mg | Freq: Once | INTRAMUSCULAR | Status: AC
  Administered 2015-07-23: 4 mg via INTRAVENOUS
  Filled 2015-07-23: qty 2

## 2015-07-23 MED ORDER — SODIUM CHLORIDE 0.9 % IV BOLUS (SEPSIS)
1000.0000 mL | Freq: Once | INTRAVENOUS | Status: AC
  Administered 2015-07-23: 1000 mL via INTRAVENOUS

## 2015-07-23 MED ORDER — ONDANSETRON HCL 4 MG PO TABS: 4.0000 mg | ORAL_TABLET | Freq: Three times a day (TID) | ORAL | Status: DC | PRN

## 2015-07-23 NOTE — Discharge Instructions (Signed)
Please seek medical attention for any high fevers, chest pain, shortness of breath, change in behavior, persistent vomiting, bloody stool or any other new or concerning symptoms. ° ° °Abdominal Pain, Adult °Many things can cause belly (abdominal) pain. Most times, the belly pain is not dangerous. Many cases of belly pain can be watched and treated at home. °HOME CARE  °· Do not take medicines that help you go poop (laxatives) unless told to by your doctor. °· Only take medicine as told by your doctor. °· Eat or drink as told by your doctor. Your doctor will tell you if you should be on a special diet. °GET HELP IF: °· You do not know what is causing your belly pain. °· You have belly pain while you are sick to your stomach (nauseous) or have runny poop (diarrhea). °· You have pain while you pee or poop. °· Your belly pain wakes you up at night. °· You have belly pain that gets worse or better when you eat. °· You have belly pain that gets worse when you eat fatty foods. °· You have a fever. °GET HELP RIGHT AWAY IF:  °· The pain does not go away within 2 hours. °· You keep throwing up (vomiting). °· The pain changes and is only in the right or left part of the belly. °· You have bloody or tarry looking poop. °MAKE SURE YOU:  °· Understand these instructions. °· Will watch your condition. °· Will get help right away if you are not doing well or get worse. °  °This information is not intended to replace advice given to you by your health care provider. Make sure you discuss any questions you have with your health care provider. °  °Document Released: 06/29/2007 Document Revised: 01/31/2014 Document Reviewed: 09/19/2012 °Elsevier Interactive Patient Education ©2016 Elsevier Inc[employer] ° °

## 2015-07-23 NOTE — ED Notes (Signed)
Pt. States dysuria for the past 4 days.  Pt. States intermittent lt. Flank pain.  Pt. Also states nausea with vomiting for the past two days.

## 2015-07-23 NOTE — ED Provider Notes (Signed)
[facility] Emergency Department Provider Note    ____________________________________________  Time seen: ~2150  I have reviewed the triage vital signs and the nursing notes.   HISTORY  Chief Complaint Pelvic Pain   History limited by: Not Limited   HPI Michelle Rowland is a 30 y.o. female who presents to the emergency department today because of concerns for abdominal pain, right flank pain and nausea. The symptoms have begun on for roughly the past 4 days. She says that one of her children has been sick since she's been neglecting her own home. She says the pain is worse in her right flank. It then radiates across the lower abdomen. This has been accompanied by some difficulty with urination although patient denies any burning with urination. She denies any change in defecation. No abnormal vaginal discharge. Denies any fevers. Denies similar symptoms in the past.   Past Medical History  Diagnosis Date  . Multiple gastric ulcers   . Depression   . Anxiety   . Anxiety   . Migraines     There are no active problems to display for this patient.   Past Surgical History  Procedure Laterality Date  . Tonsillectomy      Current Outpatient Rx  Name  Route  Sig  Dispense  Refill  . benzonatate (TESSALON) 100 MG capsule   Oral   Take 100 mg by mouth every 8 (eight) hours.          Marland Kitchen. buPROPion (WELLBUTRIN XL) 150 MG 24 hr tablet   Oral   Take 150 mg by mouth daily.         . Cetirizine HCl 10 MG CAPS   Oral   Take 1 capsule (10 mg total) by mouth once.   30 capsule   1   . chlorpheniramine-HYDROcodone (TUSSIONEX PENNKINETIC ER) 10-8 MG/5ML SUER   Oral   Take 5 mLs by mouth 2 (two) times daily.   140 mL   0   . clonazePAM (KLONOPIN) 0.5 MG disintegrating tablet   Oral   Take 0.5 mg by mouth 3 (three) times daily as needed. For anxiety      1   . diphenhydrAMINE (BENADRYL) 25 MG tablet   Oral   Take 25 mg by mouth every 6 (six)  hours as needed.         Marland Kitchen. EPIPEN 2-PAK 0.3 MG/0.3ML SOAJ injection      as needed.           Dispense as written.   . naproxen (NAPROSYN) 250 MG tablet   Oral   Take 1 tablet (250 mg total) by mouth 2 (two) times daily with a meal.   30 tablet   0   . Norethindrone Acetate-Ethinyl Estrad-FE (LOESTRIN 24 FE) 1-20 MG-MCG(24) tablet   Oral   Take 1 tablet by mouth daily.      9   . ondansetron (ZOFRAN) 4 MG tablet   Oral   Take 1 tablet (4 mg total) by mouth daily as needed for nausea or vomiting.   10 tablet   1   . predniSONE (DELTASONE) 10 MG tablet      Take 50mg  tomorrow. Then take 40mg  for two days Then take 30mg  for two days Then take 20mg  for two days Then take 10mg  for two days   25 tablet   0   . QUEtiapine (SEROQUEL) 100 MG tablet   Oral   Take 1 tablet by mouth at bedtime.         .Marland Kitchen  ranitidine (ZANTAC) 150 MG tablet   Oral   Take 1 tablet (150 mg total) by mouth 2 (two) times daily.   60 tablet   1   . venlafaxine XR (EFFEXOR-XR) 75 MG 24 hr capsule   Oral   Take 75 mg by mouth daily.           Allergies Amoxicillin; Cephalosporins; and Penicillins  History reviewed. No pertinent family history.  Social History Social History  Substance Use Topics  . Smoking status: Never Smoker   . Smokeless tobacco: None  . Alcohol Use: No    Review of Systems  Constitutional: Negative for fever. Cardiovascular: Negative for chest pain. Respiratory: Negative for shortness of breath. Gastrointestinal: Positive for right flank pain, abdominal pain Neurological: Negative for headaches, focal weakness or numbness.  10-point ROS otherwise negative.  ____________________________________________   PHYSICAL EXAM:  VITAL SIGNS: ED Triage Vitals  Enc Vitals Group     BP 07/23/15 1922 114/64 mmHg     Pulse Rate 07/23/15 1922 95     Resp 07/23/15 1922 18     Temp 07/23/15 1922 98.4 F (36.9 C)     Temp Source 07/23/15 1922 Oral     SpO2  07/23/15 1922 100 %     Weight 07/23/15 1922 230 lb (104.327 kg)     Height 07/23/15 1922 5\' 4"  (1.626 m)     Head Cir --      Peak Flow --      Pain Score 07/23/15 1923 8   Constitutional: Alert and oriented. Well appearing and in no distress. Eyes: Conjunctivae are normal. PERRL. Normal extraocular movements. ENT   Head: Normocephalic and atraumatic.   Nose: No congestion/rhinnorhea.   Mouth/Throat: Mucous membranes are moist.   Neck: No stridor. Hematological/Lymphatic/Immunilogical: No cervical lymphadenopathy. Cardiovascular: Normal rate, regular rhythm.  No murmurs, rubs, or gallops. Respiratory: Normal respiratory effort without tachypnea nor retractions. Breath sounds are clear and equal bilaterally. No wheezes/rales/rhonchi. Gastrointestinal: Soft and Minimally tender to palpation in the right lower quadrant and right flank. Positive right CVA tenderness. No tympany.  Genitourinary: Deferred Musculoskeletal: Normal range of motion in all extremities. No joint effusions.  No lower extremity tenderness nor edema. Neurologic:  Normal speech and language. No gross focal neurologic deficits are appreciated.  Skin:  Skin is warm, dry and intact. No rash noted. Psychiatric: Mood and affect are normal. Speech and behavior are normal. Patient exhibits appropriate insight and judgment.  ____________________________________________    LABS (pertinent positives/negatives)  Labs Reviewed  URINALYSIS COMPLETEWITH MICROSCOPIC ([facility] ONLY) - Abnormal; Notable for the following:    Color, Urine YELLOW (*)    APPearance CLOUDY (*)    Leukocytes, UA TRACE (*)    Squamous Epithelial / LPF TOO NUMEROUS TO COUNT (*)    All other components within normal limits  CBC WITH DIFFERENTIAL/PLATELET - Abnormal; Notable for the following:    Basophils Absolute 0.3 (*)    All other components within normal limits  COMPREHENSIVE METABOLIC PANEL  LIPASE, BLOOD  POC URINE PREG, ED  POCT  PREGNANCY, URINE     ____________________________________________   EKG  None  ____________________________________________    RADIOLOGY  None  ____________________________________________   PROCEDURES  Procedure(s) performed: None  Critical Care performed: No  ____________________________________________   INITIAL IMPRESSION / ASSESSMENT AND PLAN / ED COURSE  Pertinent labs & imaging results that were available during my care of the patient were reviewed by me and considered in my medical decision making (see  chart for details).  Patient presented to the emergency department today because of concerns for abdominal pain and nausea. On exam patient had some mild tenderness. Blood work and urine without any concerning findings. Patient afebrile here. Did have a discussion with the patient at this point. Did offer to perform pelvic exam given no other obvious etiology. Do not feel any emergent abdominal imaging is necessary given blood work and physical exam being generally not concerning. Patient does state that she has an appointment with her gynecologist this week. She deferred a pelvic exam at this time. At this point did discuss strict abdominal pain return precautions. Will plan on giving prescription for antinausea medicine and pain medication  ____________________________________________   FINAL CLINICAL IMPRESSION(S) / ED DIAGNOSES  Final diagnoses:  Abdominal pain, unspecified abdominal location     Note: This dictation was prepared with Dragon dictation. Any transcriptional errors that result from this process are unintentional    Phineas Semen, MD 07/24/15 0003

## 2015-07-24 NOTE — ED Notes (Signed)
Pt. Going home with friend 

## 2015-12-01 ENCOUNTER — Encounter
Admission: RE | Admit: 2015-12-01 | Discharge: 2015-12-01 | Disposition: A | Discharge status: Home / Self Care | Payer: BLUE CROSS/BLUE SHIELD | Source: Ambulatory Visit | Attending: Obstetrics and Gynecology | Admitting: Obstetrics and Gynecology

## 2015-12-01 DIAGNOSIS — Z01812 Encounter for preprocedural laboratory examination: Secondary | ICD-10-CM | POA: Insufficient documentation

## 2015-12-01 HISTORY — DX: Pelvic and perineal pain: R10.2

## 2015-12-01 LAB — BASIC METABOLIC PANEL
ANION GAP: 7 (ref 5–15)
BUN: 9 mg/dL (ref 6–20)
CO2: 24 mmol/L (ref 22–32)
Calcium: 9.3 mg/dL (ref 8.9–10.3)
Chloride: 105 mmol/L (ref 101–111)
Creatinine, Ser: 0.75 mg/dL (ref 0.44–1.00)
Glucose, Bld: 81 mg/dL (ref 65–99)
POTASSIUM: 3.8 mmol/L (ref 3.5–5.1)
SODIUM: 136 mmol/L (ref 135–145)

## 2015-12-01 LAB — TYPE AND SCREEN
ABO/RH(D): A POS
ANTIBODY SCREEN: NEGATIVE

## 2015-12-01 LAB — CBC
HEMATOCRIT: 37.7 % (ref 35.0–47.0)
Hemoglobin: 12.6 g/dL (ref 12.0–16.0)
MCH: 27.6 pg (ref 26.0–34.0)
MCHC: 33.4 g/dL (ref 32.0–36.0)
MCV: 82.8 fL (ref 80.0–100.0)
Platelets: 228 10*3/uL (ref 150–440)
RBC: 4.55 MIL/uL (ref 3.80–5.20)
RDW: 13.9 % (ref 11.5–14.5)
WBC: 7.4 10*3/uL (ref 3.6–11.0)

## 2015-12-01 LAB — SURGICAL PCR SCREEN
MRSA, PCR: NEGATIVE
STAPHYLOCOCCUS AUREUS: NEGATIVE

## 2015-12-01 NOTE — Patient Instructions (Signed)
  Your procedure is scheduled ZO:XWRUEAon:Friday Nov. 17 , 2017. Report to Same Day Surgery. To find out your arrival time please call [phone] between 1PM - 3PM on Thursday Nov. 16, 2017.  Remember: Instructions that are not followed completely may result in serious medical risk, up to and including death, or upon the discretion of your surgeon and anesthesiologist your surgery may need to be rescheduled.    _x___ 1. Do not eat food or drink liquids after midnight. No gum chewing or hard candies.     ____ 2. No Alcohol for 24 hours before or after surgery.   ____ 3. Bring all medications with you on the day of surgery if instructed.    __x__ 4. Notify your doctor if there is any change in your medical condition     (cold, fever, infections).    _____ 5. No smoking 24 hours prior to surgery.     Do not wear jewelry, make-up, hairpins, clips or nail polish.  Do not wear lotions, powders, or perfumes.   Do not shave 48 hours prior to surgery. Men may shave face and neck.  Do not bring valuables to the hospital.    [facility] for any belongings or valuables.               Contacts, dentures or bridgework may not be worn into surgery.  Leave your suitcase in the car. After surgery it may be brought to your room.  For patients admitted to the hospital, discharge time is determined by your treatment team.   Patients discharged the day of surgery will not be allowed to drive home.    Please read over the following fact sheets that you were given:   [facility] Preparing [facility]  __x__ Take these medicines the morning of surgery with A SIP OF WATER:    1. ranitidine (ZANTAC) bedtime the night before and morning of surgery.   __x__ Fleet Enema (as directed) morning of surgery before shower.   __x_ Use CHG Soap as directed on instruction sheet  ____ Use inhalers on the day of surgery and bring to hospital day of surgery  ____ Stop metformin 2 days prior to  surgery    ____ Take 1/2 of usual insulin dose the night before surgery and none on the morning of surgery.   ____ Stop Coumadin/Plavix/aspirin on does not apply.  __x__ Stop Anti-inflammatories such as Advil, Aleve, Ibuprofen, Motrin, Naproxen, Naprosyn, Goodies powders or aspirin products. OK to take Tylenol or Tramadol.   __x__ Stop supplements:riboflavin (VITAMIN B-2)  until after surgery.    ____ Bring C-Pap to the hospital.

## 2015-12-01 NOTE — Pre-Procedure Instructions (Signed)
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  See EKG from 02/25/15.  Pt was in ER with panic attack.

## 2015-12-01 NOTE — H&P (Signed)
Michelle Rowland is a 30 y.o. female here for dx L/S .pt with a 4 month h/o pelvic pain , worse on left , now bilat . U/s failed to reveal etiology and she has been on OCPs with change  In pain . No dyspareunia. BM nl    Past Medical History:  has a past medical history of Allergic state; Anemia, unspecified; Anxiety, unspecified; Depression, unspecified; Eczema, unspecified; Migraines; and Ulcer (CMS-HCC).  Past Surgical History:  has a past surgical history that includes Tonsillectomy (2004); wisdom teeth removed; and Colposcopy (2008). Family History: family history includes Breast cancer in her paternal grandmother; Hypertension in her father, maternal grandfather, maternal grandmother, mother, and paternal grandmother; Mental illness in her father. Social History:  reports that she has never smoked. She has never used smokeless tobacco. She reports that she does not drink alcohol or use illicit drugs. OB/GYN History:  OB History    Gravida Para Term Preterm AB Living   5 4 4  1 4    SAB TAB Ectopic Multiple Live Births   1          Allergies: is allergic to amoxicillin; cephalosporins; and penicillin. Medications:  Current Outpatient Prescriptions:  .  cetirizine (ZYRTEC) 10 mg capsule, Take 10 mg by mouth once daily., Disp: , Rfl:  .  L norgest/e.estradiol-e.estrad (SEASONIQUE) 0.15 mg-30 mcg (84)/10 mcg (7), Take 1 tablet by mouth once daily for 91 doses., Disp: 91 each, Rfl: 4 .  magnesium oxide 500 mg Tab, Take 500 mg by mouth once daily., Disp: 30 each, Rfl: 11 .  pyridoxine, vitamin B6, (VITAMIN B-6) 25 MG tablet, Take 1 tablet (25 mg total) by mouth once daily., Disp: 30 tablet, Rfl: 11 .  riboflavin, vitamin B2, 100 mg Tab, Take 2 tablets (200 mg total) by mouth once daily., Disp: 60 tablet, Rfl: 11 .  venlafaxine (EFFEXOR-XR) 150 MG XR capsule, Take 150 mg by mouth once daily., Disp: , Rfl:   Review of Systems: General:                      No fatigue or weight  loss Eyes:                           No vision changes Ears:                            No hearing difficulty Respiratory:                No cough or shortness of breath Pulmonary:                  No asthma or shortness of breath Cardiovascular:           No chest pain, palpitations, dyspnea on exertion Gastrointestinal:          No abdominal bloating, chronic diarrhea, constipations, masses, pain or hematochezia Genitourinary:             No hematuria, dysuria, abnormal vaginal discharge, pelvic pain, Menometrorrhagia Lymphatic:                   No swollen lymph nodes Musculoskeletal:         No muscle weakness Neurologic:                  No extremity weakness, syncope, seizure disorder Psychiatric:  No history of depression, delusions or suicidal/homicidal ideation    Exam:      Vitals:   10/28/15 1557  BP: 128/82  Pulse: 99    Body mass index is 40.17 kg/(m^2).  WDWN  black female in NAD   Lungs: CTA  CV : RRR without murmur     M OB History    Gravida Para Term Preterm AB Living   5 4 4  1 4    SAB TAB Ectopic Multiple Live Births   1         Abdomen: soft , no mass, normal active bowel sounds,  Mild TTP bilat , no rebound tenderness  Michelle Rowland is a 30 y.o. female here for Follow-up .pt with a 4 month h/o pelvic pain , worse on left , now bilat . U/s failed to reveal etiology and she has been on OCPs with change  In pain . No dyspareunia. BM nl    Past Medical History:  has a past medical history of Allergic state; Anemia, unspecified; Anxiety, unspecified; Depression, unspecified; Eczema, unspecified; Migraines; and Ulcer (CMS-HCC).  Past Surgical History:  has a past surgical history that includes Tonsillectomy (2004); wisdom teeth removed; and Colposcopy (2008). Family History: family history includes Breast cancer in her paternal grandmother; Hypertension in her father, maternal grandfather, maternal grandmother, mother, and  paternal grandmother; Mental illness in her father. Social History:  reports that she has never smoked. She has never used smokeless tobacco. She reports that she does not drink alcohol or use illicit drugs. OB/GYN History:  OB History    Gravida Para Term Preterm AB Living   5 4 4  1 4    SAB TAB Ectopic Multiple Live Births   1          Allergies: is allergic to amoxicillin; cephalosporins; and penicillin. Medications:  Current Outpatient Prescriptions:  .  cetirizine (ZYRTEC) 10 mg capsule, Take 10 mg by mouth once daily., Disp: , Rfl:  .  L norgest/e.estradiol-e.estrad (SEASONIQUE) 0.15 mg-30 mcg (84)/10 mcg (7), Take 1 tablet by mouth once daily for 91 doses., Disp: 91 each, Rfl: 4 .  magnesium oxide 500 mg Tab, Take 500 mg by mouth once daily., Disp: 30 each, Rfl: 11 .  pyridoxine, vitamin B6, (VITAMIN B-6) 25 MG tablet, Take 1 tablet (25 mg total) by mouth once daily., Disp: 30 tablet, Rfl: 11 .  riboflavin, vitamin B2, 100 mg Tab, Take 2 tablets (200 mg total) by mouth once daily., Disp: 60 tablet, Rfl: 11 .  venlafaxine (EFFEXOR-XR) 150 MG XR capsule, Take 150 mg by mouth once daily., Disp: , Rfl:   Review of Systems: General:                      No fatigue or weight loss Eyes:                           No vision changes Ears:                            No hearing difficulty Respiratory:                No cough or shortness of breath Pulmonary:                  No asthma or shortness of breath Cardiovascular:  No chest pain, palpitations, dyspnea on exertion Gastrointestinal:          No abdominal bloating, chronic diarrhea, constipations, masses, pain or hematochezia Genitourinary:             No hematuria, dys    Exam:      Vitals:   10/28/15 1557  BP: 128/82  Pulse: 99    Body mass index is 40.17 kg/(m^2).  WDWN  black female in NAD   Lungs: CTA  CV : RRR without murmur    Neck:  no thyromegaly Abdomen: soft , no mass, normal  active bowel sounds,  Mild TTP bilat , no rebound tenderness pelvic : V/V nl  cx  No cmt  utx NT  Adnexa ; mild TTP no mass Impression:   The encounter diagnosis was Pelvic pain in female. Possible endometriosis vs adhesions .    Plan:    diagnostic L/S   Benefits and risks to surgery: The proposed benefit of the surgery has been discussed with the patient. The possible risks include, but are not limited to: organ injury to the bowel , bladder, ureters, and major blood vessels and nerves. There is a possibility of additional surgeries resulting from these injuries. There is also the risk of blood transfusion and the need to receive blood products during or after the procedure which may rarely lead to HIV or Hepatitis C infection. There is a risk of developing a deep venous thrombosis or a pulmonary embolism . There is the possibility of wound infection and also anesthetic complications, even the rare possibility of death. The patient understands these risks and wishes to proceed. All questions have been answered and the consent has been signed. No orders of the defined types were placed in this encounter.    THOMAS Elenora Gamma, MD       I             O     Vi           In               Medication List

## 2015-12-02 ENCOUNTER — Inpatient Hospital Stay: Admission: RE | Admit: 2015-12-02 | Payer: BLUE CROSS/BLUE SHIELD | Source: Ambulatory Visit

## 2015-12-08 ENCOUNTER — Emergency Department
Admission: EM | Admit: 2015-12-08 | Discharge: 2015-12-08 | Disposition: A | Discharge status: Home / Self Care | Payer: BLUE CROSS/BLUE SHIELD | Attending: Emergency Medicine | Admitting: Emergency Medicine

## 2015-12-08 ENCOUNTER — Encounter: Payer: Self-pay | Admitting: Emergency Medicine

## 2015-12-08 DIAGNOSIS — O26891 Other specified pregnancy related conditions, first trimester: Secondary | ICD-10-CM | POA: Insufficient documentation

## 2015-12-08 DIAGNOSIS — Z3A08 8 weeks gestation of pregnancy: Secondary | ICD-10-CM | POA: Diagnosis not present

## 2015-12-08 DIAGNOSIS — Z79899 Other long term (current) drug therapy: Secondary | ICD-10-CM | POA: Insufficient documentation

## 2015-12-08 DIAGNOSIS — R102 Pelvic and perineal pain: Secondary | ICD-10-CM | POA: Diagnosis not present

## 2015-12-08 DIAGNOSIS — Z3491 Encounter for supervision of normal pregnancy, unspecified, first trimester: Secondary | ICD-10-CM

## 2015-12-08 DIAGNOSIS — R3911 Hesitancy of micturition: Secondary | ICD-10-CM | POA: Insufficient documentation

## 2015-12-08 LAB — URINALYSIS COMPLETE WITH MICROSCOPIC (ARMC ONLY)
Bilirubin Urine: NEGATIVE
GLUCOSE, UA: NEGATIVE mg/dL
Hgb urine dipstick: NEGATIVE
KETONES UR: NEGATIVE mg/dL
Leukocytes, UA: NEGATIVE
NITRITE: NEGATIVE
Protein, ur: NEGATIVE mg/dL
SPECIFIC GRAVITY, URINE: 1.019 (ref 1.005–1.030)
pH: 6 (ref 5.0–8.0)

## 2015-12-08 MED ORDER — DIPHENHYDRAMINE HCL 25 MG PO CAPS: 25.0000 mg | ORAL_CAPSULE | Freq: Four times a day (QID) | ORAL | 0 refills | Status: AC | PRN

## 2015-12-08 MED ORDER — METOCLOPRAMIDE HCL 10 MG PO TABS: 10.0000 mg | ORAL_TABLET | Freq: Four times a day (QID) | ORAL | 0 refills | Status: DC | PRN

## 2015-12-08 NOTE — ED Triage Notes (Signed)
Patient presents to the ED with dysuria and halting urinating and pelvic pain.  Patient states she recently found out she was pregnant and is estimated to be [redacted] weeks pregnant.  Patient denies vaginal bleeding.  Patient reports chronic pelvic pain and states prior to her pregnancy her ob-gyn was planning on doing a laparoscopic exploratory surgery and potentially remove a fallopian tube.

## 2015-12-08 NOTE — ED Provider Notes (Signed)
[facility] Emergency Department Provider Note  ____________________________________________  Time seen: Approximately 9:10 PM  I have reviewed the triage vital signs and the nursing notes.   HISTORY  Chief Complaint Urinary Frequency and Pelvic Pain    HPI Pascal Luxleeah R Rookstool is a 30 y.o. female who complains of chronic pelvic pain who was about to have a diagnostic laparoscopy by Dr. Feliberto GottronSchermerhorn to evaluate for endometriosis when she decreased discovered she was pregnant. Last period was 2 months ago.No vaginal bleeding or discharge. No dysuria but she does feel like she has frequency with urination and ongoing pelvic pain. No fevers chills chest pain or shortness of breath. She had urinalysis and urine culture done by her gynecology office yesterday. She is having nausea but no vomiting. Tolerating oral intake.    Past Medical History:  Diagnosis Date  . Anxiety   . Anxiety   . Depression   . Migraines   . Multiple gastric ulcers   . Pelvic pain      There are no active problems to display for this patient.    Past Surgical History:  Procedure Laterality Date  . TONSILLECTOMY    . WISDOM TOOTH EXTRACTION  2009   4 teeth     Prior to Admission medications   Medication Sig Start Date End Date Taking? Authorizing Provider  Cetirizine HCl 10 MG CAPS Take 1 capsule (10 mg total) by mouth once. Patient taking differently: Take 10 mg by mouth daily as needed.  03/03/15   Tommi Rumpshonda L Summers, PA-C  clonazePAM (KLONOPIN) 0.5 MG disintegrating tablet Take 0.5 mg by mouth 3 (three) times daily as needed. For anxiety 09/24/14   Historical Provider, MD  diphenhydrAMINE (BENADRYL) 25 mg capsule Take 1 capsule (25 mg total) by mouth every 6 (six) hours as needed. 12/08/15   Sharman CheekPhillip Heavenlee Maiorana, MD  EPIPEN 2-PAK 0.3 MG/0.3ML SOAJ injection as needed. 02/24/15   Historical Provider, MD  Magnesium 500 MG CAPS Take by mouth every morning.    Historical Provider, MD   metoCLOPramide (REGLAN) 10 MG tablet Take 1 tablet (10 mg total) by mouth every 6 (six) hours as needed. 12/08/15   Sharman CheekPhillip Jazmon Kos, MD  Norethindrone Acetate-Ethinyl Estrad-FE (LOESTRIN 24 FE) 1-20 MG-MCG(24) tablet Take 1 tablet by mouth daily. 08/23/14   Historical Provider, MD  ranitidine (ZANTAC) 150 MG tablet Take 1 tablet (150 mg total) by mouth 2 (two) times daily. Patient taking differently: Take 150 mg by mouth 2 (two) times daily as needed.  03/03/15 03/02/16  Tommi Rumpshonda L Summers, PA-C  riboflavin (VITAMIN B-2) 100 MG TABS tablet Take 100 mg by mouth every morning. 2 tablets    Historical Provider, MD  traMADol (ULTRAM) 50 MG tablet Take 50 mg by mouth every 6 (six) hours as needed.    Historical Provider, MD     Allergies Penicillins; Amoxicillin; and Cephalosporins   No family history on file.  Social History Social History  Substance Use Topics  . Smoking status: Never Smoker  . Smokeless tobacco: Never Used  . Alcohol use No     Comment: 1 drink per year    Review of Systems  Constitutional:   No fever or chills.   Cardiovascular:   No chest pain. Respiratory:   No dyspnea or cough. Gastrointestinal:   Chronic abdominal pain. No vomiting or diarrhea Genitourinary:   Negative for dysuria , positive urinary hesitancy. 10-point ROS otherwise negative.  ____________________________________________   PHYSICAL EXAM:  VITAL SIGNS: ED Triage Vitals  Enc Vitals Group     BP 12/08/15 1855 123/83     Pulse Rate 12/08/15 1855 79     Resp 12/08/15 1855 18     Temp 12/08/15 1855 98.5 F (36.9 C)     Temp Source 12/08/15 1855 Oral     SpO2 12/08/15 1855 100 %     Weight 12/08/15 1856 230 lb (104.3 kg)     Height 12/08/15 1856 5\' 4"  (1.626 m)     Head Circumference --      Peak Flow --      Pain Score 12/08/15 1856 8     Pain Loc --      Pain Edu? --      Excl. in GC? --     Vital signs reviewed, nursing assessments reviewed.   Constitutional:   Alert and  oriented. Well appearing and in no distress. Eyes:   No scleral icterus. No conjunctival pallor. PERRL. EOMI.  No nystagmus. ENT   Head:   Normocephalic and atraumatic.   Nose:   No congestion/rhinnorhea. No septal hematoma   Mouth/Throat:   MMM, no pharyngeal erythema. No peritonsillar mass.    Neck:   No stridor. No SubQ emphysema. No meningismus. Hematological/Lymphatic/Immunilogical:   No cervical lymphadenopathy. Cardiovascular:   RRR. Symmetric bilateral radial and DP pulses.  No murmurs.  Respiratory:   Normal respiratory effort without tachypnea nor retractions. Breath sounds are clear and equal bilaterally. No wheezes/rales/rhonchi. Gastrointestinal:   Soft and nontender. Non distended. There is no CVA tenderness.  No rebound, rigidity, or guarding. Genitourinary:   deferred Musculoskeletal:   Nontender with normal range of motion in all extremities. No joint effusions.  No lower extremity tenderness.  No edema. Neurologic:   Normal speech and language.  CN 2-10 normal. Motor grossly intact. No gross focal neurologic deficits are appreciated.  Skin:    Skin is warm, dry and intact. No rash noted.  No petechiae, purpura, or bullae.  ____________________________________________    LABS (pertinent positives/negatives) (all labs ordered are listed, but only abnormal results are displayed) Labs Reviewed  URINALYSIS COMPLETEWITH MICROSCOPIC ([facility] ONLY) - Abnormal; Notable for the following:       Result Value   Color, Urine YELLOW (*)    APPearance CLOUDY (*)    Bacteria, UA RARE (*)    Squamous Epithelial / LPF 0-5 (*)    All other components within normal limits  URINE CULTURE   ____________________________________________   EKG    ____________________________________________    RADIOLOGY    ____________________________________________   PROCEDURES Procedures  ____________________________________________   INITIAL IMPRESSION / ASSESSMENT AND  PLAN / ED COURSE  Pertinent labs & imaging results that were available during my care of the patient were reviewed by me and considered in my medical decision making (see chart for details).  Patient well appearing no acute distress. Vital signs normal. Overall think her presentation is consistent with some morning sickness as well as ongoing chronic pain possibly from endometriosis with implants in the abdomen that are now being affected by the developing pregnancy and hormonal changes.  Considering the patient's symptoms, medical history, and physical examination today, I have low suspicion for cholecystitis or biliary pathology, pancreatitis, perforation or bowel obstruction, hernia, intra-abdominal abscess, AAA or dissection, volvulus or intussusception, mesenteric ischemia, or appendicitis.  Low suspicion of PID or torsion or TOA. She has an outpatient ultrasound scheduled in 2 days to evaluate the pregnancy and is being followed by gynecology. She is stable to  continue her outpatient management. She is taking prenatal vitamins. Counseled on medication use in pregnancy.     Clinical Course    ____________________________________________   FINAL CLINICAL IMPRESSION(S) / ED DIAGNOSES  Final diagnoses:  Pelvic pain in female  First trimester pregnancy       Portions of this note were generated with dragon dictation software. Dictation errors may occur despite best attempts at proofreading.    Sharman Cheek, MD 12/08/15 2115

## 2015-12-10 LAB — URINE CULTURE

## 2015-12-11 ENCOUNTER — Ambulatory Visit: Admit: 2015-12-11 | Payer: BLUE CROSS/BLUE SHIELD | Admitting: Obstetrics and Gynecology

## 2015-12-11 SURGERY — LAPAROSCOPY, DIAGNOSTIC
Anesthesia: General

## 2017-09-04 ENCOUNTER — Emergency Department
Admission: EM | Admit: 2017-09-04 | Discharge: 2017-09-04 | Disposition: A | Discharge status: Home / Self Care | Payer: BLUE CROSS/BLUE SHIELD | Attending: Emergency Medicine | Admitting: Emergency Medicine

## 2017-09-04 ENCOUNTER — Other Ambulatory Visit: Payer: Self-pay

## 2017-09-04 ENCOUNTER — Encounter: Payer: Self-pay | Admitting: *Deleted

## 2017-09-04 DIAGNOSIS — Z79899 Other long term (current) drug therapy: Secondary | ICD-10-CM | POA: Diagnosis not present

## 2017-09-04 DIAGNOSIS — R51 Headache: Secondary | ICD-10-CM | POA: Diagnosis present

## 2017-09-04 DIAGNOSIS — M791 Myalgia, unspecified site: Secondary | ICD-10-CM | POA: Diagnosis not present

## 2017-09-04 DIAGNOSIS — M7918 Myalgia, other site: Secondary | ICD-10-CM

## 2017-09-04 DIAGNOSIS — G43909 Migraine, unspecified, not intractable, without status migrainosus: Secondary | ICD-10-CM | POA: Insufficient documentation

## 2017-09-04 MED ORDER — KETOROLAC TROMETHAMINE 30 MG/ML IJ SOLN
30.0000 mg | Freq: Once | INTRAMUSCULAR | Status: AC
  Administered 2017-09-04: 30 mg via INTRAVENOUS
  Filled 2017-09-04: qty 1

## 2017-09-04 MED ORDER — DIPHENHYDRAMINE HCL 50 MG/ML IJ SOLN
50.0000 mg | Freq: Once | INTRAMUSCULAR | Status: AC
  Administered 2017-09-04: 50 mg via INTRAVENOUS
  Filled 2017-09-04: qty 1

## 2017-09-04 MED ORDER — METOCLOPRAMIDE HCL 5 MG/ML IJ SOLN
10.0000 mg | Freq: Once | INTRAMUSCULAR | Status: AC
  Administered 2017-09-04: 10 mg via INTRAVENOUS
  Filled 2017-09-04: qty 2

## 2017-09-04 MED ORDER — SODIUM CHLORIDE 0.9 % IV BOLUS
1000.0000 mL | Freq: Once | INTRAVENOUS | Status: AC
Start: 2017-09-04 — End: 2017-09-04
  Administered 2017-09-04: 1000 mL via INTRAVENOUS

## 2017-09-04 MED ORDER — CYCLOBENZAPRINE HCL 5 MG PO TABS: 5.0000 mg | ORAL_TABLET | Freq: Three times a day (TID) | ORAL | 0 refills | Status: DC | PRN

## 2017-09-04 NOTE — ED Notes (Signed)
PT reports migraine headache for about 10 days, taking meds, but no relief. Pt alert and oriented in room without difficulty.

## 2017-09-04 NOTE — ED Provider Notes (Signed)
[facility] Emergency Department Provider Note  Time seen: 4:14 PM  I have reviewed the triage vital signs and the nursing notes.   HISTORY  Chief Complaint Migraine    HPI Michelle Rowland is a 32 y.o. female with a past medical history of anxiety, migraines, depression, presents to the emergency department for headache.  According to the patient for the past 10 days she has been experiencing a headache.  Patient states a history of migraine headaches has a headache every several weeks per patient but usually she can take medications at home and then will go away.  States she has been using over-the-counter medications for the past 10 days with minimal relief of the headache so she came to the emergency department hoping for relief.  She also states that secondary complaint approximately 3 days ago she fell landing on her buttocks however states since that fall she has been experiencing pain and tenderness in her shoulders and into her neck.  Denies any fever.  Does state photo and phonophobia.  States that headache is consistent with her prior migraines.   Past Medical History:  Diagnosis Date  . Anxiety   . Anxiety   . Depression   . Migraines   . Multiple gastric ulcers   . Pelvic pain     There are no active problems to display for this patient.   Past Surgical History:  Procedure Laterality Date  . TONSILLECTOMY    . WISDOM TOOTH EXTRACTION  2009   4 teeth    Prior to Admission medications   Medication Sig Start Date End Date Taking? Authorizing Provider  Cetirizine HCl 10 MG CAPS Take 1 capsule (10 mg total) by mouth once. Patient taking differently: Take 10 mg by mouth daily as needed.  03/03/15   Tommi Rumps, PA-C  clonazePAM (KLONOPIN) 0.5 MG disintegrating tablet Take 0.5 mg by mouth 3 (three) times daily as needed. For anxiety 09/24/14   [provider]  diphenhydrAMINE (BENADRYL) 25 mg capsule Take 1 capsule (25 mg total) by  mouth every 6 (six) hours as needed. 12/08/15   Sharman Cheek, MD  EPIPEN 2-PAK 0.3 MG/0.3ML SOAJ injection as needed. 02/24/15   [provider]  Magnesium 500 MG CAPS Take by mouth every morning.    [provider]  metoCLOPramide (REGLAN) 10 MG tablet Take 1 tablet (10 mg total) by mouth every 6 (six) hours as needed. 12/08/15   Sharman Cheek, MD  Norethindrone Acetate-Ethinyl Estrad-FE (LOESTRIN 24 FE) 1-20 MG-MCG(24) tablet Take 1 tablet by mouth daily. 08/23/14   [provider]  ranitidine (ZANTAC) 150 MG tablet Take 1 tablet (150 mg total) by mouth 2 (two) times daily. Patient taking differently: Take 150 mg by mouth 2 (two) times daily as needed.  03/03/15 03/02/16  Tommi Rumps, PA-C  riboflavin (VITAMIN B-2) 100 MG TABS tablet Take 100 mg by mouth every morning. 2 tablets    [provider]  traMADol (ULTRAM) 50 MG tablet Take 50 mg by mouth every 6 (six) hours as needed.    [provider]  pantoprazole (PROTONIX) 40 MG tablet Take 1 tablet (40 mg total) by mouth daily. Patient taking differently: Take 40 mg by mouth daily as needed.  07/20/14 03/03/15  Schaevitz, Myra Rude, MD    Allergies  Allergen Reactions  . Penicillins Anaphylaxis, Swelling and Other (See Comments)    Has patient had a PCN reaction causing immediate rash, facial/tongue/throat swelling, SOB or lightheadedness  with hypotension: Yes Has patient had a PCN reaction causing severe rash involving mucus membranes or skin necrosis: No Has patient had a PCN reaction that required hospitalization Yes Has patient had a PCN reaction occurring within the last 10 years: Yes If all of the above answers are "NO", then may proceed with Cephalosporin use.  Marland Kitchen. Amoxicillin Swelling  . Cephalosporins Swelling and Other (See Comments)    No family history on file.  Social History Social History   Tobacco Use  . Smoking status: Never Smoker  . Smokeless tobacco: Never Used   Substance Use Topics  . Alcohol use: No    Comment: 1 drink per year  . Drug use: No    Review of Systems Constitutional: Negative for fever. Eyes: Photophobia ENT: Negative for recent illness/congestion Cardiovascular: Negative for chest pain. Respiratory: Negative for shortness of breath. Gastrointestinal: Negative for abdominal pain Genitourinary: Negative for urinary compaints Musculoskeletal: States upper back and neck tightness/discomfort Skin: Negative for skin complaints  Neurological: Headache.  Denies any focal weakness or numbness confusion or slurred speech. All other ROS negative  ____________________________________________   PHYSICAL EXAM:  VITAL SIGNS: ED Triage Vitals  Enc Vitals Group     BP --      Pulse Rate 09/04/17 1531 90     Resp 09/04/17 1531 18     Temp 09/04/17 1531 99.2 F (37.3 C)     Temp Source 09/04/17 1531 Oral     SpO2 09/04/17 1531 98 %     Weight 09/04/17 1533 230 lb (104.3 kg)     Height 09/04/17 1533 5\' 4"  (1.626 m)     Head Circumference --      Peak Flow --      Pain Score 09/04/17 1533 9     Pain Loc --      Pain Edu? --      Excl. in GC? --    Constitutional: Alert and oriented. Well appearing and in no distress. Eyes: Normal exam ENT   Head: Normocephalic and atraumatic.   Mouth/Throat: Mucous membranes are moist. Cardiovascular: Normal rate, regular rhythm Respiratory: Normal respiratory effort without tachypnea nor retractions. Breath sounds are clear  Gastrointestinal: Soft and nontender. No distention.  Musculoskeletal: Mild tenderness palpation over bilateral trapezius muscles. Neurologic:  Normal speech and language. No gross focal neurologic deficits  Skin:  Skin is warm, dry and intact.  Psychiatric: Mood and affect are normal.   ____________________________________________   INITIAL IMPRESSION / ASSESSMENT AND PLAN / ED COURSE  Pertinent labs & imaging results that were available during my care of  the patient were reviewed by me and considered in my medical decision making (see chart for details).  Patient presents to the emergency department for headache ongoing for the past 10 days.  Patient states it feels identical to prior migraine headaches, not better with over-the-counter medications however.  We will treat with migraine cocktail medications as well as IV fluids and continue to closely monitor.  As a secondary complaint patient states she fell 3 days ago did not hit her head or pass out.  Has been experiencing increased pain and stiffness in her shoulders and into her neck since the fall.  Patient does have moderate tenderness to examination over the trapezius muscles bilaterally.  I suspect more muscular skeletal pain however this could also be contributing to her current headache.  As long as the patient improves with migraine cocktail medications will likely discharge with cyclobenzaprine and Tylenol to be  used as needed.  Patient agreeable to this plan of care.  Patient's headache is much improved continues to have neck and back stiffness/discomfort we will discharge with Flexeril I discussed Tylenol use as well.  Patient agreeable to plan of care. ____________________________________________   FINAL CLINICAL IMPRESSION(S) / ED DIAGNOSES  Migraine headache Muscular skeletal pain    Minna AntisPaduchowski, Edouard Gikas, MD 09/04/17 [phone]

## 2017-09-04 NOTE — ED Triage Notes (Signed)
Pt ambulatory to triage.  Pt reports a migrane h/a for 10 days.  Pt has nausea.  Pt also slipped 2 days ago and did not fall.  Pt also reports neck and upper back   Pt alert.  Speech clear.  Pt taking prescription meds without pain relief.

## 2018-05-14 ENCOUNTER — Emergency Department: Payer: BLUE CROSS/BLUE SHIELD

## 2018-05-14 ENCOUNTER — Encounter: Payer: Self-pay | Admitting: Emergency Medicine

## 2018-05-14 ENCOUNTER — Emergency Department
Admission: EM | Admit: 2018-05-14 | Discharge: 2018-05-14 | Disposition: A | Discharge status: Home / Self Care | Payer: BLUE CROSS/BLUE SHIELD | Attending: Emergency Medicine | Admitting: Emergency Medicine

## 2018-05-14 ENCOUNTER — Other Ambulatory Visit: Payer: Self-pay

## 2018-05-14 DIAGNOSIS — J40 Bronchitis, not specified as acute or chronic: Secondary | ICD-10-CM | POA: Insufficient documentation

## 2018-05-14 DIAGNOSIS — Z79899 Other long term (current) drug therapy: Secondary | ICD-10-CM | POA: Diagnosis not present

## 2018-05-14 DIAGNOSIS — R05 Cough: Secondary | ICD-10-CM | POA: Diagnosis present

## 2018-05-14 DIAGNOSIS — M940 Chondrocostal junction syndrome [Tietze]: Secondary | ICD-10-CM | POA: Diagnosis not present

## 2018-05-14 LAB — BASIC METABOLIC PANEL
Anion gap: 12 (ref 5–15)
BUN: 12 mg/dL (ref 6–20)
CO2: 24 mmol/L (ref 22–32)
Calcium: 8.9 mg/dL (ref 8.9–10.3)
Chloride: 102 mmol/L (ref 98–111)
Creatinine, Ser: 0.78 mg/dL (ref 0.44–1.00)
GFR calc Af Amer: >60 mL/min (ref >60)
GFR calc non Af Amer: >60 mL/min (ref >60)
Glucose, Bld: 85 mg/dL (ref 70–99)
Potassium: 3.6 mmol/L (ref 3.5–5.1)
Sodium: 138 mmol/L (ref 135–145)

## 2018-05-14 LAB — CBC WITH DIFFERENTIAL/PLATELET
Abs Immature Granulocytes: 0.02 10*3/uL (ref 0.00–0.07)
Basophils Absolute: 0 10*3/uL (ref 0.0–0.1)
Basophils Relative: 0 %
Eosinophils Absolute: 0.1 10*3/uL (ref 0.0–0.5)
Eosinophils Relative: 1 %
HCT: 37.9 % (ref 36.0–46.0)
Hemoglobin: 11.9 g/dL — ABNORMAL LOW (ref 12.0–15.0)
Immature Granulocytes: 0 %
Lymphocytes Relative: 32 %
Lymphs Abs: 2.9 10*3/uL (ref 0.7–4.0)
MCH: 26.9 pg (ref 26.0–34.0)
MCHC: 31.4 g/dL (ref 30.0–36.0)
MCV: 85.6 fL (ref 80.0–100.0)
Monocytes Absolute: 0.8 10*3/uL (ref 0.1–1.0)
Monocytes Relative: 8 %
Neutro Abs: 5.3 10*3/uL (ref 1.7–7.7)
Neutrophils Relative %: 59 %
Platelets: 259 10*3/uL (ref 150–400)
RBC: 4.43 MIL/uL (ref 3.87–5.11)
RDW: 13.2 % (ref 11.5–15.5)
WBC: 9.1 10*3/uL (ref 4.0–10.5)
nRBC: 0 % (ref 0.0–0.2)

## 2018-05-14 LAB — TROPONIN I: Troponin I: <0.03 ng/mL (ref <0.03)

## 2018-05-14 MED ORDER — BENZONATATE 100 MG PO CAPS: 100.0000 mg | ORAL_CAPSULE | Freq: Three times a day (TID) | ORAL | 0 refills | Status: AC | PRN

## 2018-05-14 MED ORDER — PREDNISONE 10 MG (21) PO TBPK: ORAL_TABLET | ORAL | 0 refills | Status: DC

## 2018-05-14 NOTE — ED Provider Notes (Signed)
[facility] Emergency Department Provider Note  ____________________________________________  Time seen: Approximately 5:13 PM  I have reviewed the triage vital signs and the nursing notes.   HISTORY  Chief Complaint Cough and Chest Pain    HPI Michelle Rowland is a 33 y.o. female presents to the emergency department with productive cough for the past 3 weeks and reproducible, anterior chest wall pain that has also occurred for approximately 1 week.  She states that both of her children had cold-like symptoms several weeks ago and her symptoms started with nasal congestion, rhinorrhea and cough.  Patient denies shortness of breath or chest tightness.  She denies a history of cardiac issues.  She denies emesis or diarrhea.  She is tolerating fluids at home. No other alleviating measures have been attempted.         Past Medical History:  Diagnosis Date  . Anxiety   . Anxiety   . Depression   . Migraines   . Multiple gastric ulcers   . Pelvic pain     There are no active problems to display for this patient.   Past Surgical History:  Procedure Laterality Date  . TONSILLECTOMY    . WISDOM TOOTH EXTRACTION  2009   4 teeth    Prior to Admission medications   Medication Sig Start Date End Date Taking? Authorizing Provider  benzonatate (TESSALON PERLES) 100 MG capsule Take 1 capsule (100 mg total) by mouth 3 (three) times daily as needed for up to 7 days for cough. 05/14/18 05/21/18  Orvil FeilWoods, Rebbeca Sheperd M, PA-C  Cetirizine HCl 10 MG CAPS Take 1 capsule (10 mg total) by mouth once. Patient taking differently: Take 10 mg by mouth daily as needed.  03/03/15   Tommi RumpsSummers, Rhonda L, PA-C  clonazePAM (KLONOPIN) 0.5 MG disintegrating tablet Take 0.5 mg by mouth 3 (three) times daily as needed. For anxiety 09/24/14   [provider]  cyclobenzaprine (FLEXERIL) 5 MG tablet Take 1 tablet (5 mg total) by mouth 3 (three) times daily as needed for muscle spasms. 09/04/17    Minna AntisPaduchowski, Kevin, MD  diphenhydrAMINE (BENADRYL) 25 mg capsule Take 1 capsule (25 mg total) by mouth every 6 (six) hours as needed. 12/08/15   Sharman CheekStafford, Phillip, MD  EPIPEN 2-PAK 0.3 MG/0.3ML SOAJ injection as needed. 02/24/15   [provider]  Magnesium 500 MG CAPS Take by mouth every morning.    [provider]  metoCLOPramide (REGLAN) 10 MG tablet Take 1 tablet (10 mg total) by mouth every 6 (six) hours as needed. 12/08/15   Sharman CheekStafford, Phillip, MD  Norethindrone Acetate-Ethinyl Estrad-FE (LOESTRIN 24 FE) 1-20 MG-MCG(24) tablet Take 1 tablet by mouth daily. 08/23/14   [provider]  predniSONE (STERAPRED UNI-PAK 21 TAB) 10 MG (21) TBPK tablet Take 6 tablets the first day, take 5 tablets the second day, take 4 tablets the third day, take 3 tablets the fourth day, take 2 tablets the fifth day, take 1 tablet the sixth day. 05/14/18   Orvil FeilWoods, Lorell Thibodaux M, PA-C  ranitidine (ZANTAC) 150 MG tablet Take 1 tablet (150 mg total) by mouth 2 (two) times daily. Patient taking differently: Take 150 mg by mouth 2 (two) times daily as needed.  03/03/15 03/02/16  Tommi RumpsSummers, Rhonda L, PA-C  riboflavin (VITAMIN B-2) 100 MG TABS tablet Take 100 mg by mouth every morning. 2 tablets    [provider]  traMADol (ULTRAM) 50 MG tablet Take 50 mg by mouth every 6 (six) hours as needed.  [provider]    Allergies Penicillins; Amoxicillin; and Cephalosporins  No family history on file.  Social History Social History   Tobacco Use  . Smoking status: Never Smoker  . Smokeless tobacco: Never Used  Substance Use Topics  . Alcohol use: No    Comment: 1 drink per year  . Drug use: No     Review of Systems  Constitutional: No fever/chills Eyes: No visual changes. No discharge ENT: No upper respiratory complaints. Cardiovascular: no chest pain. Respiratory: Patient has cough. No SOB. Gastrointestinal: No abdominal pain.  No nausea, no vomiting.  No diarrhea.  No  constipation. Musculoskeletal: Patient has anterior chest wall pain.  Skin: Negative for rash, abrasions, lacerations, ecchymosis. Neurological: Negative for headaches, focal weakness or numbness.  ____________________________________________   PHYSICAL EXAM:  VITAL SIGNS: ED Triage Vitals  Enc Vitals Group     BP 05/14/18 1539 114/76     Pulse Rate 05/14/18 1539 83     Resp 05/14/18 1539 20     Temp 05/14/18 1539 98.9 F (37.2 C)     Temp Source 05/14/18 1539 Oral     SpO2 05/14/18 1539 100 %     Weight 05/14/18 1540 230 lb (104.3 kg)     Height 05/14/18 1540 5\' 4"  (1.626 m)     Head Circumference --      Peak Flow --      Pain Score 05/14/18 1540 8     Pain Loc --      Pain Edu? --      Excl. in [place]? --      Constitutional: Alert and oriented. Well appearing and in no acute distress. Eyes: Conjunctivae are normal. PERRL. EOMI. Head: Atraumatic. ENT:      Ears: TMs are pearly.      Nose: No congestion/rhinnorhea.      Mouth/Throat: Mucous membranes are moist.  Neck: No stridor.  No cervical spine tenderness to palpation. Cardiovascular: Normal rate, regular rhythm. Normal S1 and S2.  Good peripheral circulation.  Patient has reproducible pain with palpation of the anterior chest wall. Respiratory: Normal respiratory effort without tachypnea or retractions. Lungs CTAB. Good air entry to the bases with no decreased or absent breath sounds. Gastrointestinal: Bowel sounds 4 quadrants. Soft and nontender to palpation. No guarding or rigidity. No palpable masses. No distention. No CVA tenderness. Musculoskeletal: Full range of motion to all extremities. No gross deformities appreciated. Neurologic:  Normal speech and language. No gross focal neurologic deficits are appreciated.  Skin:  Skin is warm, dry and intact. No rash noted. Psychiatric: Mood and affect are normal. Speech and behavior are normal. Patient exhibits appropriate insight and  judgement.   ____________________________________________   LABS (all labs ordered are listed, but only abnormal results are displayed)  Labs Reviewed  CBC WITH DIFFERENTIAL/PLATELET - Abnormal; Notable for the following components:      Result Value   Hemoglobin 11.9 (*)    All other components within normal limits  BASIC METABOLIC PANEL  TROPONIN I   ____________________________________________  EKG  Normal sinus rhythm without ST segment elevation.  No apparent arrhythmia. ____________________________________________  RADIOLOGY I personally viewed and evaluated these images as part of my medical decision making, as well as reviewing the written report by the radiologist.  Dg Chest Portable 1 View  Result Date: 05/14/2018 CLINICAL DATA:  Acute cough for 3 weeks.  Chest pain for 2 weeks. EXAM: PORTABLE CHEST 1 VIEW COMPARISON:  02/25/2015 and prior radiographs FINDINGS: The  cardiomediastinal silhouette is unremarkable. Mild peribronchial thickening noted. There is no evidence of focal airspace disease, pulmonary edema, suspicious pulmonary nodule/mass, pleural effusion, or pneumothorax. No acute bony abnormalities are identified. IMPRESSION: Mild peribronchial thickening without focal pneumonia. Electronically Signed   By: Harmon Pier M.D.   On: 05/14/2018 16:23    ____________________________________________    PROCEDURES  Procedure(s) performed:    Procedures  Narrow QRS with no ST segment elevation or apparent arrhythmia.  No T wave abnormalities.  Medications - No data to display   ____________________________________________   INITIAL IMPRESSION / ASSESSMENT AND PLAN / ED COURSE  Pertinent labs & imaging results that were available during my care of the patient were reviewed by me and considered in my medical decision making (see chart for details).  Review of the [place] CSRS was performed in accordance of the [facility] prior to dispensing any controlled drugs.          Assessment and plan Costochondritis Bronchitis 33 year old female with an unremarkable past medical history presents to the emergency department with productive cough and anterior chest wall pain for the past 2 to 3 weeks.  On physical exam, patient was resting comfortably without apparent shortness of breath.  Patient had reproducible anterior chest wall pain to palpation.  No adventitious lung sounds were auscultated on physical exam.  Differential diagnosis included community-acquired pneumonia, bronchitis, costochondritis and PE.  Chest x-ray revealed no consolidations, opacities or infiltrates that would suggest community-acquired pneumonia.  Basic labs were reassuring in the emergency department.  Troponin was within reference range.  EKG revealed normal sinus rhythm without ST segment elevation or apparent arrhythmia.  Patient has 1.3% chance of PE given Wells criteria and had a PERC score of 0.  Patient was discharged with prednisone and Tessalon Perles.  She was advised to follow-up with primary care as needed.  All patient questions were answered.      ____________________________________________  FINAL CLINICAL IMPRESSION(S) / ED DIAGNOSES  Final diagnoses:  Costochondritis  Bronchitis      NEW MEDICATIONS STARTED DURING THIS VISIT:  ED Discharge Orders         Ordered    predniSONE (STERAPRED UNI-PAK 21 TAB) 10 MG (21) TBPK tablet     05/14/18 1703    benzonatate (TESSALON PERLES) 100 MG capsule  3 times daily PRN     05/14/18 1703              This chart was dictated using voice recognition software/Dragon. Despite best efforts to proofread, errors can occur which can change the meaning. Any change was purely unintentional.    Orvil Feil, PA-C 05/14/18 1728    Don Perking, [place], MD 05/14/18 [phone]

## 2018-05-14 NOTE — ED Triage Notes (Signed)
Patient states she has had a cough x 3 weeks and chest pain x 2 weeks.  Patient states chest pain has gotten worse but cough has gotten better.  Patient states chest pain is worse with talking, deep breathing and coughing.  Patient has nasal congestion.  Denies fever.

## 2018-08-23 ENCOUNTER — Ambulatory Visit: Payer: Self-pay | Admitting: Family Medicine

## 2018-08-29 ENCOUNTER — Other Ambulatory Visit: Payer: Self-pay

## 2018-08-29 ENCOUNTER — Encounter: Payer: Self-pay | Admitting: Emergency Medicine

## 2018-08-29 ENCOUNTER — Emergency Department: Payer: BC Managed Care – PPO

## 2018-08-29 ENCOUNTER — Emergency Department
Admission: EM | Admit: 2018-08-29 | Discharge: 2018-08-29 | Disposition: A | Discharge status: Home / Self Care | Payer: BC Managed Care – PPO | Attending: Student | Admitting: Student

## 2018-08-29 DIAGNOSIS — H9203 Otalgia, bilateral: Secondary | ICD-10-CM | POA: Diagnosis present

## 2018-08-29 DIAGNOSIS — H6993 Unspecified Eustachian tube disorder, bilateral: Secondary | ICD-10-CM | POA: Insufficient documentation

## 2018-08-29 DIAGNOSIS — J181 Lobar pneumonia, unspecified organism: Secondary | ICD-10-CM | POA: Diagnosis not present

## 2018-08-29 DIAGNOSIS — Z20828 Contact with and (suspected) exposure to other viral communicable diseases: Secondary | ICD-10-CM | POA: Diagnosis not present

## 2018-08-29 DIAGNOSIS — Z79899 Other long term (current) drug therapy: Secondary | ICD-10-CM | POA: Diagnosis not present

## 2018-08-29 DIAGNOSIS — H6983 Other specified disorders of Eustachian tube, bilateral: Secondary | ICD-10-CM

## 2018-08-29 DIAGNOSIS — J189 Pneumonia, unspecified organism: Secondary | ICD-10-CM

## 2018-08-29 LAB — SARS CORONAVIRUS 2 BY RT PCR (HOSPITAL ORDER, PERFORMED IN ~~LOC~~ HOSPITAL LAB): SARS Coronavirus 2: NEGATIVE

## 2018-08-29 MED ORDER — PREDNISONE 10 MG PO TABS: ORAL_TABLET | ORAL | 0 refills | Status: DC

## 2018-08-29 MED ORDER — AZITHROMYCIN 250 MG PO TABS: ORAL_TABLET | ORAL | 0 refills | Status: DC

## 2018-08-29 NOTE — Discharge Instructions (Signed)
Follow-up with your primary care provider in 1 to 2 weeks.  Return to the emergency department if any severe worsening of your symptoms or difficulty breathing.  Begin taking medication as directed.  The Zithromax is for infection.  The prednisone should help with inflammation both for your lungs and for your ears.  You may take Tylenol with this medication but do not take other anti-inflammatories with prednisone.  Increase fluids.

## 2018-08-29 NOTE — ED Provider Notes (Signed)
Michelle Rowland.   [facility] Emergency Department Provider Note   ____________________________________________   First MD Initiated Contact with Patient 08/29/18 [phone]     (approximate)  I have reviewed the triage vital signs and the nursing notes.   HISTORY  Chief Complaint Otalgia   HPI Michelle Rowland is a 33 y.o. female presents to the ED with complaint of bilateral ear pain with pain running from her ear into her neck.  Patient states that she had a telemedicine conference and was prescribed doxycycline for "a sinus infection".  Patient has continued to have ear pain since that time and feels like there is "water running".  Patient is also had a cough without feelings of fever or chills.  She denies any change in smell or taste.  There is been no nausea or vomiting.  She denies any known exposure to COVID but states that members of her family have been tested and they were negative.      Past Medical History:  Diagnosis Date  . Anxiety   . Anxiety   . Depression   . Migraines   . Multiple gastric ulcers   . Pelvic pain     There are no active problems to display for this patient.   Past Surgical History:  Procedure Laterality Date  . TONSILLECTOMY    . WISDOM TOOTH EXTRACTION  2009   4 teeth    Prior to Admission medications   Medication Sig Start Date End Date Taking? Authorizing Provider  azithromycin (ZITHROMAX Z-PAK) 250 MG tablet Take 2 tablets (500 mg) on  Day 1,  followed by 1 tablet (250 mg) once daily on Days 2 through 5. 08/29/18   Tommi RumpsSummers, Dwayne Begay L, PA-C  Cetirizine HCl 10 MG CAPS Take 1 capsule (10 mg total) by mouth once. Patient taking differently: Take 10 mg by mouth daily as needed.  03/03/15   Tommi RumpsSummers, Cataleyah Colborn L, PA-C  clonazePAM (KLONOPIN) 0.5 MG disintegrating tablet Take 0.5 mg by mouth 3 (three) times daily as needed. For anxiety 09/24/14   [provider]  cyclobenzaprine (FLEXERIL) 5 MG tablet Take 1 tablet (5 mg total) by  mouth 3 (three) times daily as needed for muscle spasms. 09/04/17   Minna AntisPaduchowski, Kevin, MD  diphenhydrAMINE (BENADRYL) 25 mg capsule Take 1 capsule (25 mg total) by mouth every 6 (six) hours as needed. 12/08/15   Sharman CheekStafford, Phillip, MD  EPIPEN 2-PAK 0.3 MG/0.3ML SOAJ injection as needed. 02/24/15   [provider]  Magnesium 500 MG CAPS Take by mouth every morning.    [provider]  metoCLOPramide (REGLAN) 10 MG tablet Take 1 tablet (10 mg total) by mouth every 6 (six) hours as needed. 12/08/15   Sharman CheekStafford, Phillip, MD  Norethindrone Acetate-Ethinyl Estrad-FE (LOESTRIN 24 FE) 1-20 MG-MCG(24) tablet Take 1 tablet by mouth daily. 08/23/14   [provider]  predniSONE (DELTASONE) 10 MG tablet Take 6 tablets  today, on day 2 take 5 tablets, day 3 take 4 tablets, day 4 take 3 tablets, day 5 take  2 tablets and 1 tablet the last day 08/29/18   Tommi RumpsSummers, Tiann Saha L, PA-C  ranitidine (ZANTAC) 150 MG tablet Take 1 tablet (150 mg total) by mouth 2 (two) times daily. Patient taking differently: Take 150 mg by mouth 2 (two) times daily as needed.  03/03/15 03/02/16  Tommi RumpsSummers, Britania Shreeve L, PA-C  riboflavin (VITAMIN B-2) 100 MG TABS tablet Take 100 mg by mouth every morning. 2 tablets    [provider]  traMADol (ULTRAM) 50 MG tablet Take 50 mg by mouth every 6 (six) hours as needed.    [provider]    Allergies Penicillins, Amoxicillin, and Cephalosporins  No family history on file.  Social History Social History   Tobacco Use  . Smoking status: Never Smoker  . Smokeless tobacco: Never Used  Substance Use Topics  . Alcohol use: No    Comment: 1 drink per year  . Drug use: No    Review of Systems Constitutional: No fever/chills Eyes: No visual changes. ENT: No sore throat.  Positive for ear pain. Cardiovascular: Denies chest pain. Respiratory: Denies shortness of breath. Gastrointestinal: No abdominal pain.  No nausea, no vomiting.  No diarrhea.   Genitourinary: Negative for dysuria. Musculoskeletal: Negative for muscle aches. Skin: Negative for rash. Neurological: Negative for headaches, focal weakness or numbness. ____________________________________________   PHYSICAL EXAM:  VITAL SIGNS: ED Triage Vitals  Enc Vitals Group     BP 08/29/18 0817 134/89     Pulse Rate 08/29/18 0817 83     Resp 08/29/18 0817 16     Temp 08/29/18 0817 98.5 F (36.9 C)     Temp Source 08/29/18 0817 Oral     SpO2 08/29/18 0817 99 %     Weight --      Height --      Head Circumference --      Peak Flow --      Pain Score 08/29/18 0818 7     Pain Loc --      Pain Edu? --      Excl. in [place]? --    Constitutional: Alert and oriented. Well appearing and in no acute distress. Eyes: Conjunctivae are normal.  Head: Atraumatic. Nose: No congestion/rhinnorhea.  TMs bilaterally are non-erythematous but are dull with poor light reflex. Mouth/Throat: Mucous membranes are moist.  Oropharynx non-erythematous. Neck: No stridor.   Hematological/Lymphatic/Immunilogical: No cervical lymphadenopathy. Cardiovascular: Normal rate, regular rhythm. Grossly normal heart sounds.  Good peripheral circulation. Respiratory: Normal respiratory effort.  No retractions. Lungs CTAB. Gastrointestinal: Soft and nontender. No distention. Musculoskeletal: Moves upper and lower extremities without any difficulty normal gait was noted. Neurologic:  Normal speech and language. No gross focal neurologic deficits are appreciated. No gait instability. Skin:  Skin is warm, dry and intact. No rash noted. Psychiatric: Mood and affect are normal. Speech and behavior are normal.  ____________________________________________   LABS (all labs ordered are listed, but only abnormal results are displayed)  Labs Reviewed  SARS CORONAVIRUS 2 (HOSPITAL ORDER, [place] [facility])     [place]   Official radiology report(s): Dg Chest Portable 1 View  Result  Date: 08/29/2018 CLINICAL DATA:  Cough x1 week EXAM: PORTABLE CHEST 1 VIEW COMPARISON:  Chest radiograph 05/14/2018 FINDINGS: Heart size is within normal limits. Ill-defined opacity at the medial right lung base. No consolidation within the left lung. No evidence of pleural effusion or pneumothorax. No acute bony abnormality. Thoracic dextrocurvature. IMPRESSION: Ill-defined opacity at the medial right lung base is favored to reflect incomplete atelectasis. Pneumonia cannot be excluded. Electronically Signed   By: Kellie Simmering   On: 08/29/2018 09:12    ____________________________________________   PROCEDURES  Procedure(s) performed (including Critical Care):  Procedures   ____________________________________________   INITIAL IMPRESSION / ASSESSMENT AND PLAN / ED COURSE  As part of my medical decision making, I reviewed the following data within the electronic MEDICAL RECORD NUMBER Notes from prior ED visits and [place] Controlled Substance Database  33 year old female presents to the ED with complaint of bilateral ear pain for 1 week and cough without fever.  She denies any exposure to COVID but states family has been tested and were negative.  She has completed a course of doxycycline without any relief.  Patient had a telemedicine conference and was treated for a sinus infection initially.  Physical exam shows TMs with poor light reflex and patient was unable to open her eustachian tubes to equalize pressure.  Chest x-ray could not exclude a right lower lobe pneumonia.  Patient was placed on Zithromax and prednisone.  She is to follow-up with her primary care in 1 to 2 weeks if any continued problems.  She is return to the emergency department if any severe worsening of her symptoms or difficulty breathing.  A COVID test was pending at the time of her discharge.  Michelle Rowland was evaluated in Emergency Department on 08/29/2018 for the symptoms described in the history of present illness. She was  evaluated in the context of the global COVID-19 pandemic, which necessitated consideration that the patient might be at risk for infection with the SARS-CoV-2 virus that causes COVID-19. Institutional protocols and algorithms that pertain to the evaluation of patients at risk for COVID-19 are in a state of rapid change based on information released by regulatory bodies including the CDC and federal and state organizations. These policies and algorithms were followed during the patient's care in the ED.  ____________________________________________   FINAL CLINICAL IMPRESSION(S) / ED DIAGNOSES  Final diagnoses:  Community acquired pneumonia of right lower lobe of lung (HCC)  Eustachian tube dysfunction, bilateral     ED Discharge Orders         Ordered    azithromycin (ZITHROMAX Z-PAK) 250 MG tablet     08/29/18 0932    predniSONE (DELTASONE) 10 MG tablet     08/29/18 0932           Note:  This document was prepared using Dragon voice recognition software and may include unintentional dictation errors.    Tommi RumpsSummers, Eustolia Drennen L, PA-C 08/29/18 [id number]    Miguel AschoffMonks, Sarah L., MD 08/29/18 2010

## 2018-08-29 NOTE — ED Triage Notes (Signed)
PT c/o bilat ear pain and pain running down neck. Pt states UC and fast med would not see her. PT recently given antibiotics for sinus infection. NAD noted. VSS

## 2018-09-09 ENCOUNTER — Encounter: Payer: Self-pay | Admitting: Intensive Care

## 2018-09-09 ENCOUNTER — Emergency Department: Payer: BC Managed Care – PPO

## 2018-09-09 ENCOUNTER — Emergency Department
Admission: EM | Admit: 2018-09-09 | Discharge: 2018-09-09 | Disposition: A | Discharge status: Home / Self Care | Payer: BC Managed Care – PPO | Attending: Emergency Medicine | Admitting: Emergency Medicine

## 2018-09-09 ENCOUNTER — Other Ambulatory Visit: Payer: Self-pay

## 2018-09-09 DIAGNOSIS — Z20828 Contact with and (suspected) exposure to other viral communicable diseases: Secondary | ICD-10-CM | POA: Insufficient documentation

## 2018-09-09 DIAGNOSIS — M940 Chondrocostal junction syndrome [Tietze]: Secondary | ICD-10-CM

## 2018-09-09 DIAGNOSIS — R0602 Shortness of breath: Secondary | ICD-10-CM | POA: Diagnosis not present

## 2018-09-09 DIAGNOSIS — Z79899 Other long term (current) drug therapy: Secondary | ICD-10-CM | POA: Insufficient documentation

## 2018-09-09 DIAGNOSIS — R071 Chest pain on breathing: Secondary | ICD-10-CM | POA: Insufficient documentation

## 2018-09-09 DIAGNOSIS — R079 Chest pain, unspecified: Secondary | ICD-10-CM | POA: Insufficient documentation

## 2018-09-09 LAB — TROPONIN I (HIGH SENSITIVITY): Troponin I (High Sensitivity): 7 ng/L (ref <18)

## 2018-09-09 LAB — CBC
HCT: 38.4 % (ref 36.0–46.0)
Hemoglobin: 12.3 g/dL (ref 12.0–15.0)
MCH: 27.3 pg (ref 26.0–34.0)
MCHC: 32 g/dL (ref 30.0–36.0)
MCV: 85.3 fL (ref 80.0–100.0)
Platelets: 228 10*3/uL (ref 150–400)
RBC: 4.5 MIL/uL (ref 3.87–5.11)
RDW: 13.7 % (ref 11.5–15.5)
WBC: 5.1 10*3/uL (ref 4.0–10.5)
nRBC: 0 % (ref 0.0–0.2)

## 2018-09-09 LAB — BASIC METABOLIC PANEL
Anion gap: 7 (ref 5–15)
BUN: 12 mg/dL (ref 6–20)
CO2: 25 mmol/L (ref 22–32)
Calcium: 8.7 mg/dL — ABNORMAL LOW (ref 8.9–10.3)
Chloride: 105 mmol/L (ref 98–111)
Creatinine, Ser: 0.84 mg/dL (ref 0.44–1.00)
GFR calc Af Amer: >60 mL/min (ref >60)
GFR calc non Af Amer: >60 mL/min (ref >60)
Glucose, Bld: 96 mg/dL (ref 70–99)
Potassium: 3.7 mmol/L (ref 3.5–5.1)
Sodium: 137 mmol/L (ref 135–145)

## 2018-09-09 LAB — FIBRIN DERIVATIVES D-DIMER (ARMC ONLY): Fibrin derivatives D-dimer (ARMC): 550.85 ng/mL (FEU) — ABNORMAL HIGH (ref 0.00–499.00)

## 2018-09-09 LAB — SARS CORONAVIRUS 2 (TAT 6-24 HRS): SARS Coronavirus 2: NEGATIVE

## 2018-09-09 MED ORDER — NAPROXEN 500 MG PO TABS: 500.0000 mg | ORAL_TABLET | Freq: Two times a day (BID) | ORAL | 0 refills | Status: AC

## 2018-09-09 MED ORDER — KETOROLAC TROMETHAMINE 30 MG/ML IJ SOLN
15.0000 mg | Freq: Once | INTRAMUSCULAR | Status: AC
  Administered 2018-09-09: 15 mg via INTRAVENOUS
  Filled 2018-09-09: qty 1

## 2018-09-09 MED ORDER — IOHEXOL 350 MG/ML SOLN
75.0000 mL | Freq: Once | INTRAVENOUS | Status: AC | PRN
  Administered 2018-09-09: 75 mL via INTRAVENOUS

## 2018-09-09 MED ORDER — SODIUM CHLORIDE 0.9% FLUSH: 3.0000 mL | Freq: Once | INTRAVENOUS | Status: DC

## 2018-09-09 MED ORDER — LIDOCAINE 5 % EX PTCH: 1.0000 | MEDICATED_PATCH | Freq: Two times a day (BID) | CUTANEOUS | 0 refills | Status: AC

## 2018-09-09 NOTE — ED Notes (Signed)
Pt ambulatory to toilet with steady gait noted.  

## 2018-09-09 NOTE — ED Notes (Signed)
Pt taken to xray via wheelchair

## 2018-09-09 NOTE — ED Notes (Signed)
Pt returned from CT °

## 2018-09-09 NOTE — ED Notes (Signed)
EDP at bedside  

## 2018-09-09 NOTE — ED Notes (Signed)
Verbal order from EDP to not draw second troponin.  

## 2018-09-09 NOTE — ED Triage Notes (Signed)
Patient reports being diagnosed with pneumonia around two weeks ago. Came back today due to body pain, chest pain, and "not feeling any better"

## 2018-09-09 NOTE — ED Provider Notes (Signed)
[facility] Emergency Department Provider Note   ____________________________________________   First MD Initiated Contact with Patient 09/09/18 0827     (approximate)  I have reviewed the triage vital signs and the nursing notes.   HISTORY  Chief Complaint Chest Pain    HPI Michelle Rowland is a 33 y.o. female with no significant past medical history presents to the ED complaining of chest pain.  Patient reports she has had constant pain in the right side of her chest for approximately the past 2 weeks.  She describes it as a sharp pain that seems to get worse when she takes a deep breath.  This causes her to be very uncomfortable at night, when she feels like she cannot get comfortable.  The symptoms caused her to feel short of breath, but she denies any fevers or cough.  She does state that her son was recently diagnosed with "viral pneumonia", but that he tested negative for COVID-19.  She was seen in the ED for this problem approximately 1-1/2 weeks ago, when COVID testing was negative and she was diagnosed with pneumonia.  She reports completing the full course of antibiotics but not feeling any better.        Past Medical History:  Diagnosis Date  . Anxiety   . Anxiety   . Depression   . Migraines   . Multiple gastric ulcers   . Pelvic pain     There are no active problems to display for this patient.   Past Surgical History:  Procedure Laterality Date  . TONSILLECTOMY    . WISDOM TOOTH EXTRACTION  2009   4 teeth    Prior to Admission medications   Medication Sig Start Date End Date Taking? Authorizing Provider  azithromycin (ZITHROMAX Z-PAK) 250 MG tablet Take 2 tablets (500 mg) on  Day 1,  followed by 1 tablet (250 mg) once daily on Days 2 through 5. 08/29/18   Johnn Hai, PA-C  Cetirizine HCl 10 MG CAPS Take 1 capsule (10 mg total) by mouth once. Patient taking differently: Take 10 mg by mouth daily as needed.  03/03/15   Johnn Hai, PA-C  clonazePAM (KLONOPIN) 0.5 MG disintegrating tablet Take 0.5 mg by mouth 3 (three) times daily as needed. For anxiety 09/24/14   [provider]  cyclobenzaprine (FLEXERIL) 5 MG tablet Take 1 tablet (5 mg total) by mouth 3 (three) times daily as needed for muscle spasms. 09/04/17   Harvest Dark, MD  diphenhydrAMINE (BENADRYL) 25 mg capsule Take 1 capsule (25 mg total) by mouth every 6 (six) hours as needed. 12/08/15   Carrie Mew, MD  EPIPEN 2-PAK 0.3 MG/0.3ML SOAJ injection as needed. 02/24/15   [provider]  lidocaine (LIDODERM) 5 % Place 1 patch onto the skin every 12 (twelve) hours. Remove & Discard patch within 12 hours or as directed by MD 09/09/18 09/09/19  Blake Divine, MD  Magnesium 500 MG CAPS Take by mouth every morning.    [provider]  metoCLOPramide (REGLAN) 10 MG tablet Take 1 tablet (10 mg total) by mouth every 6 (six) hours as needed. 12/08/15   Carrie Mew, MD  naproxen (NAPROSYN) 500 MG tablet Take 1 tablet (500 mg total) by mouth 2 (two) times daily with a meal for 6 days. 09/09/18 09/15/18  Blake Divine, MD  Norethindrone Acetate-Ethinyl Estrad-FE (LOESTRIN 24 FE) 1-20 MG-MCG(24) tablet Take 1 tablet by mouth daily. 08/23/14   [provider]  predniSONE (  DELTASONE) 10 MG tablet Take 6 tablets  today, on day 2 take 5 tablets, day 3 take 4 tablets, day 4 take 3 tablets, day 5 take  2 tablets and 1 tablet the last day 08/29/18   Tommi RumpsSummers, Rhonda L, PA-C  ranitidine (ZANTAC) 150 MG tablet Take 1 tablet (150 mg total) by mouth 2 (two) times daily. Patient taking differently: Take 150 mg by mouth 2 (two) times daily as needed.  03/03/15 03/02/16  Tommi RumpsSummers, Rhonda L, PA-C  riboflavin (VITAMIN B-2) 100 MG TABS tablet Take 100 mg by mouth every morning. 2 tablets    [provider]  traMADol (ULTRAM) 50 MG tablet Take 50 mg by mouth every 6 (six) hours as needed.    [provider]    Allergies  Penicillins, Amoxicillin, and Cephalosporins  History reviewed. No pertinent family history.  Social History Social History   Tobacco Use  . Smoking status: Never Smoker  . Smokeless tobacco: Never Used  Substance Use Topics  . Alcohol use: No    Comment: 1 drink per year  . Drug use: No    Review of Systems  Constitutional: No fever/chills Eyes: No visual changes. ENT: No sore throat. Cardiovascular: Positive for chest pain. Respiratory: Positive for shortness of breath. Gastrointestinal: No abdominal pain.  No nausea, no vomiting.  No diarrhea.  No constipation. Genitourinary: Negative for dysuria. Musculoskeletal: Negative for back pain. Skin: Negative for rash. Neurological: Negative for headaches, focal weakness or numbness.  ____________________________________________   PHYSICAL EXAM:  VITAL SIGNS: ED Triage Vitals  Enc Vitals Group     BP 09/09/18 0819 123/83     Pulse Rate 09/09/18 0819 84     Resp 09/09/18 0819 16     Temp --      Temp src --      SpO2 09/09/18 0819 100 %     Weight 09/09/18 0820 230 lb (104.3 kg)     Height 09/09/18 0820 5\' 4"  (1.626 m)     Head Circumference --      Peak Flow --      Pain Score 09/09/18 0820 8     Pain Loc --      Pain Edu? --      Excl. in GC? --     Constitutional: Alert and oriented. Eyes: Conjunctivae are normal. Head: Atraumatic. Nose: No congestion/rhinnorhea. Mouth/Throat: Mucous membranes are moist. Neck: Normal ROM Cardiovascular: Normal rate, regular rhythm. Grossly normal heart sounds. Respiratory: Normal respiratory effort.  No retractions. Lungs CTAB.  Right chest wall tender to palpation. Gastrointestinal: Soft and nontender. No distention. Genitourinary: deferred Musculoskeletal: No lower extremity tenderness nor edema. Neurologic:  Normal speech and language. No gross focal neurologic deficits are appreciated. Skin:  Skin is warm, dry and intact. No rash noted. Psychiatric: Mood and affect  are normal. Speech and behavior are normal.  ____________________________________________   LABS (all labs ordered are listed, but only abnormal results are displayed)  Labs Reviewed  BASIC METABOLIC PANEL - Abnormal; Notable for the following components:      Result Value   Calcium 8.7 (*)    All other components within normal limits  FIBRIN DERIVATIVES D-DIMER ([facility] ONLY) - Abnormal; Notable for the following components:   Fibrin derivatives D-dimer ([facility]) 550.85 (*)    All other components within normal limits  SARS CORONAVIRUS 2  CBC  POC URINE PREG, ED  TROPONIN I (HIGH SENSITIVITY)  TROPONIN I (HIGH SENSITIVITY)   ____________________________________________  EKG  ED  ECG REPORT I, Chesley Noonharles Rollie Hynek, the attending physician, personally viewed and interpreted this ECG.   Date: 09/09/2018  EKG Time: 8:17  Rate: 85  Rhythm: normal sinus rhythm  Axis: Normal  Intervals:none  ST&T Change: None    PROCEDURES  Procedure(s) performed (including Critical Care):  Procedures   ____________________________________________   INITIAL IMPRESSION / ASSESSMENT AND PLAN / ED COURSE       Previously healthy 33 year old female presenting to the ED with approximately 2 weeks of constant right-sided chest pain, which she describes as sharp and pleuritic.  Previous evaluation showed possible pneumonia, however patient has not improved following course of antibiotics.  It is possible her symptoms are related to COVID-19, will retest for this given possibility of false negative testing previously.  Will check labs, chest x-ray, d-dimer as patient is low risk by [place].  D-dimer elevated and CTA obtained but negative for PE or other acute process.  Troponin within normal limits and remainder of lab work unremarkable.  Do not suspect ACS as patient with constant symptoms for 2 weeks and troponin negative.  Suspect origin of symptoms is musculoskeletal, will treat symptomatically and  counseled patient to follow-up with PCP.      ____________________________________________   FINAL CLINICAL IMPRESSION(S) / ED DIAGNOSES  Final diagnoses:  Nonspecific chest pain  Costochondritis     ED Discharge Orders         Ordered    naproxen (NAPROSYN) 500 MG tablet  2 times daily with meals     09/09/18 1329    lidocaine (LIDODERM) 5 %  Every 12 hours     09/09/18 1329           Note:  This document was prepared using Dragon voice recognition software and may include unintentional dictation errors.   Chesley NoonJessup, Kristinia Leavy, MD 09/09/18 [phone]

## 2018-09-09 NOTE — ED Notes (Signed)
Pt taken to CT.

## 2018-09-09 NOTE — ED Notes (Signed)
First Nurse Note: Pt ambulatory into ED states that she was diagnosed with pneumonia 1 week ago. Pt is c/o pain in the right side of her chest.

## 2018-12-10 ENCOUNTER — Other Ambulatory Visit: Payer: Self-pay

## 2018-12-10 ENCOUNTER — Emergency Department: Payer: BC Managed Care – PPO

## 2018-12-10 ENCOUNTER — Emergency Department
Admission: EM | Admit: 2018-12-10 | Discharge: 2018-12-10 | Disposition: A | Discharge status: Home / Self Care | Payer: BC Managed Care – PPO | Attending: Student in an Organized Health Care Education/Training Program | Admitting: Student in an Organized Health Care Education/Training Program

## 2018-12-10 DIAGNOSIS — R0789 Other chest pain: Secondary | ICD-10-CM | POA: Diagnosis not present

## 2018-12-10 DIAGNOSIS — Z20828 Contact with and (suspected) exposure to other viral communicable diseases: Secondary | ICD-10-CM | POA: Insufficient documentation

## 2018-12-10 DIAGNOSIS — Z79899 Other long term (current) drug therapy: Secondary | ICD-10-CM | POA: Diagnosis not present

## 2018-12-10 DIAGNOSIS — R079 Chest pain, unspecified: Secondary | ICD-10-CM | POA: Diagnosis present

## 2018-12-10 DIAGNOSIS — Z20822 Contact with and (suspected) exposure to covid-19: Secondary | ICD-10-CM

## 2018-12-10 LAB — BASIC METABOLIC PANEL
Anion gap: 12 (ref 5–15)
BUN: 14 mg/dL (ref 6–20)
CO2: 24 mmol/L (ref 22–32)
Calcium: 9.1 mg/dL (ref 8.9–10.3)
Chloride: 102 mmol/L (ref 98–111)
Creatinine, Ser: 0.83 mg/dL (ref 0.44–1.00)
GFR calc Af Amer: >60 mL/min (ref >60)
GFR calc non Af Amer: >60 mL/min (ref >60)
Glucose, Bld: 98 mg/dL (ref 70–99)
Potassium: 3.5 mmol/L (ref 3.5–5.1)
Sodium: 138 mmol/L (ref 135–145)

## 2018-12-10 LAB — CBC
HCT: 36.6 % (ref 36.0–46.0)
Hemoglobin: 11.7 g/dL — ABNORMAL LOW (ref 12.0–15.0)
MCH: 26.8 pg (ref 26.0–34.0)
MCHC: 32 g/dL (ref 30.0–36.0)
MCV: 83.9 fL (ref 80.0–100.0)
Platelets: 219 10*3/uL (ref 150–400)
RBC: 4.36 MIL/uL (ref 3.87–5.11)
RDW: 13.1 % (ref 11.5–15.5)
WBC: 6.8 10*3/uL (ref 4.0–10.5)
nRBC: 0 % (ref 0.0–0.2)

## 2018-12-10 LAB — TROPONIN I (HIGH SENSITIVITY): Troponin I (High Sensitivity): <2 ng/L (ref <18)

## 2018-12-10 MED ORDER — PSEUDOEPH-BROMPHEN-DM 30-2-10 MG/5ML PO SYRP: 10.0000 mL | ORAL_SOLUTION | Freq: Four times a day (QID) | ORAL | 0 refills | Status: DC | PRN

## 2018-12-10 MED ORDER — FLUTICASONE PROPIONATE 50 MCG/ACT NA SUSP: 1.0000 | Freq: Two times a day (BID) | NASAL | 0 refills | Status: AC

## 2018-12-10 NOTE — ED Provider Notes (Signed)
[facility] Emergency Department Provider Note  ____________________________________________  Time seen: Approximately 3:23 PM  I have reviewed the triage vital signs and the nursing notes.   HISTORY  Chief Complaint Chest Pain and COVID exposure    HPI Michelle Rowland is a 33 y.o. female who presents the emergency department complaining of  multiple COVID-19 symptoms as well as ongoing chest tightness.  Patient reports that her boyfriend and her 2 kids are positive for COVID-19.  She developed symptoms several days ago.  Patient is complaining of chest pressure, nasal congestion, sore throat, ear fullness, body aches, cough, exertional shortness of breath.  Patient has been taking over-the-counter medication with no significant relief.  Patient is primarily here for assessment for work.  Patient has no cardiac history.  Patient denies any headaches, visual changes, neck pain or stiffness, abdominal pain, nausea vomiting, diarrhea or constipation.        Past Medical History:  Diagnosis Date  . Anxiety   . Anxiety   . Depression   . Migraines   . Multiple gastric ulcers   . Pelvic pain     There are no active problems to display for this patient.   Past Surgical History:  Procedure Laterality Date  . TONSILLECTOMY    . WISDOM TOOTH EXTRACTION  2009   4 teeth    Prior to Admission medications   Medication Sig Start Date End Date Taking? Authorizing Provider  azithromycin (ZITHROMAX Z-PAK) 250 MG tablet Take 2 tablets (500 mg) on  Day 1,  followed by 1 tablet (250 mg) once daily on Days 2 through 5. 08/29/18   Bridget Hartshorn L, PA-C  brompheniramine-pseudoephedrine-DM 30-2-10 MG/5ML syrup Take 10 mLs by mouth 4 (four) times daily as needed. 12/10/18   Rhyker Silversmith, Delorise Royals, PA-C  Cetirizine HCl 10 MG CAPS Take 1 capsule (10 mg total) by mouth once. Patient taking differently: Take 10 mg by mouth daily as needed.  03/03/15   Tommi Rumps, PA-C   clonazePAM (KLONOPIN) 0.5 MG disintegrating tablet Take 0.5 mg by mouth 3 (three) times daily as needed. For anxiety 09/24/14   [provider]  cyclobenzaprine (FLEXERIL) 5 MG tablet Take 1 tablet (5 mg total) by mouth 3 (three) times daily as needed for muscle spasms. 09/04/17   Minna Antis, MD  diphenhydrAMINE (BENADRYL) 25 mg capsule Take 1 capsule (25 mg total) by mouth every 6 (six) hours as needed. 12/08/15   Sharman Cheek, MD  EPIPEN 2-PAK 0.3 MG/0.3ML SOAJ injection as needed. 02/24/15   [provider]  fluticasone (FLONASE) 50 MCG/ACT nasal spray Place 1 spray into both nostrils 2 (two) times daily. 12/10/18   Gresia Isidoro, Delorise Royals, PA-C  lidocaine (LIDODERM) 5 % Place 1 patch onto the skin every 12 (twelve) hours. Remove & Discard patch within 12 hours or as directed by MD 09/09/18 09/09/19  Chesley Noon, MD  Magnesium 500 MG CAPS Take by mouth every morning.    [provider]  metoCLOPramide (REGLAN) 10 MG tablet Take 1 tablet (10 mg total) by mouth every 6 (six) hours as needed. 12/08/15   Sharman Cheek, MD  Norethindrone Acetate-Ethinyl Estrad-FE (LOESTRIN 24 FE) 1-20 MG-MCG(24) tablet Take 1 tablet by mouth daily. 08/23/14   [provider]  predniSONE (DELTASONE) 10 MG tablet Take 6 tablets  today, on day 2 take 5 tablets, day 3 take 4 tablets, day 4 take 3 tablets, day 5 take  2 tablets and 1 tablet the last day  08/29/18   Tommi RumpsSummers, Rhonda L, PA-C  ranitidine (ZANTAC) 150 MG tablet Take 1 tablet (150 mg total) by mouth 2 (two) times daily. Patient taking differently: Take 150 mg by mouth 2 (two) times daily as needed.  03/03/15 03/02/16  Tommi RumpsSummers, Rhonda L, PA-C  riboflavin (VITAMIN B-2) 100 MG TABS tablet Take 100 mg by mouth every morning. 2 tablets    [provider]  traMADol (ULTRAM) 50 MG tablet Take 50 mg by mouth every 6 (six) hours as needed.    [provider]    Allergies Penicillins, Amoxicillin, and  Cephalosporins  No family history on file.  Social History Social History   Tobacco Use  . Smoking status: Never Smoker  . Smokeless tobacco: Never Used  Substance Use Topics  . Alcohol use: No    Comment: 1 drink per year  . Drug use: No     Review of Systems  Constitutional: Positive fever/chills.  Positive for body aches. Eyes: No visual changes. No discharge ENT: Positive for nasal congestion, sore throat, ear fullness Cardiovascular: Positive for chest tightness Respiratory: no cough. No SOB. Gastrointestinal: No abdominal pain.  No nausea, no vomiting.  No diarrhea.  No constipation. Musculoskeletal: Negative for musculoskeletal pain. Skin: Negative for rash, abrasions, lacerations, ecchymosis. Neurological: Negative for headaches, focal weakness or numbness. 10-point ROS otherwise negative.  ____________________________________________   PHYSICAL EXAM:  VITAL SIGNS: ED Triage Vitals  Enc Vitals Group     BP 12/10/18 1357 (!) 125/56     Pulse Rate 12/10/18 1357 90     Resp 12/10/18 1357 18     Temp 12/10/18 1357 98.8 F (37.1 C)     Temp Source 12/10/18 1357 Oral     SpO2 12/10/18 1357 99 %     Weight 12/10/18 1358 230 lb (104.3 kg)     Height 12/10/18 1358 5\' 4"  (1.626 m)     Head Circumference --      Peak Flow --      Pain Score 12/10/18 1357 8     Pain Loc --      Pain Edu? --      Excl. in GC? --      Constitutional: Alert and oriented. Well appearing and in no acute distress. Eyes: Conjunctivae are normal. PERRL. EOMI. Head: Atraumatic. ENT:      Ears: EACs unremarkable bilaterally.  TMs are minimally bulging bilaterally.      Nose: No congestion/rhinnorhea.      Mouth/Throat: Mucous membranes are moist.  Neck: No stridor.  Neck is supple full range of motion  Cardiovascular: Normal rate, regular rhythm. Normal S1 and S2.  Good peripheral circulation. Respiratory: Normal respiratory effort without tachypnea or retractions. Lungs CTAB. Good  air entry to the bases with no decreased or absent breath sounds. Gastrointestinal: Bowel sounds 4 quadrants. Soft and nontender to palpation. No guarding or rigidity. No palpable masses. No distention. No CVA tenderness. Musculoskeletal: Full range of motion to all extremities. No gross deformities appreciated. Neurologic:  Normal speech and language. No gross focal neurologic deficits are appreciated.  Skin:  Skin is warm, dry and intact. No rash noted. Psychiatric: Mood and affect are normal. Speech and behavior are normal. Patient exhibits appropriate insight and judgement.   ____________________________________________   LABS (all labs ordered are listed, but only abnormal results are displayed)  Labs Reviewed  CBC - Abnormal; Notable for the following components:      Result Value   Hemoglobin 11.7 (*)  All other components within normal limits  SARS CORONAVIRUS 2 (TAT 6-24 HRS)  BASIC METABOLIC PANEL  POC URINE PREG, ED  TROPONIN I (HIGH SENSITIVITY)  TROPONIN I (HIGH SENSITIVITY)   ____________________________________________  EKG   ____________________________________________  RADIOLOGY I personally viewed and evaluated these images as part of my medical decision making, as well as reviewing the written report by the radiologist.  Dg Chest 2 View  Result Date: 12/10/2018 CLINICAL DATA:  33 year old female with chest pain and cough. EXAM: CHEST - 2 VIEW COMPARISON:  Chest radiograph dated 09/09/2018. FINDINGS: The heart size and mediastinal contours are within normal limits. Both lungs are clear. The visualized skeletal structures are unremarkable. IMPRESSION: No active cardiopulmonary disease. Electronically Signed   By: Anner Crete M.D.   On: 12/10/2018 14:31    ____________________________________________    PROCEDURES  Procedure(s) performed:    Procedures    Medications - No data to  display   ____________________________________________   INITIAL IMPRESSION / ASSESSMENT AND PLAN / ED COURSE  Pertinent labs & imaging results that were available during my care of the patient were reviewed by me and considered in my medical decision making (see chart for details).  Review of the [place] CSRS was performed in accordance of the [place] prior to dispensing any controlled drugs.           Patient's diagnosis is consistent with suspected COVID-19 infection, chest tightness.  Patient presented to emergency department complaining of multiple COVID-19 symptoms.  Patient has close exposure with 3 members of her household being positive for COVID-19.  Patient has symptoms consistent with COVID-19 and will be tested at this time.  Have informed patient that she likely is also positive and is given self quarantine and restriction instructions to the patient.  Covid test pending at this time.  Remainder of patient's work-up is reassuring with no significant abnormalities identified on physical exam.  Labs are reassuring.  At this time I suspect the chest tightness is secondary to COVID-19 infection versus any cardiac etiology..  Patient will be prescribed Bromfed cough syrup, Flonase for symptom relief.  Follow-up with primary care as needed.  Patient is given ED precautions to return to the ED for any worsening or new symptoms.     ____________________________________________  FINAL CLINICAL IMPRESSION(S) / ED DIAGNOSES  Final diagnoses:  Suspected COVID-19 virus infection  Nonspecific chest pain      NEW MEDICATIONS STARTED DURING THIS VISIT:  ED Discharge Orders         Ordered    brompheniramine-pseudoephedrine-DM 30-2-10 MG/5ML syrup  4 times daily PRN     12/10/18 1540    fluticasone (FLONASE) 50 MCG/ACT nasal spray  2 times daily     12/10/18 1540              This chart was dictated using voice recognition software/Dragon. Despite best efforts to proofread,  errors can occur which can change the meaning. Any change was purely unintentional.    Darletta Moll, PA-C 12/10/18 1541    Merlyn Lot, MD 12/10/18 [phone]

## 2018-12-10 NOTE — ED Triage Notes (Signed)
Pt c/o chest pain even when not coughing. Boyfriend dx with covid and kids also having sx.  Pt has cough and fever 101 at home. Unlabored. VSS at this time.  Having SHOB.  Ambulatory without difficulty.

## 2018-12-11 LAB — SARS CORONAVIRUS 2 (TAT 6-24 HRS): SARS Coronavirus 2: NEGATIVE

## 2018-12-17 ENCOUNTER — Emergency Department: Payer: BC Managed Care – PPO

## 2018-12-17 ENCOUNTER — Emergency Department
Admission: EM | Admit: 2018-12-17 | Discharge: 2018-12-17 | Disposition: A | Discharge status: Home / Self Care | Payer: BC Managed Care – PPO | Attending: Emergency Medicine | Admitting: Emergency Medicine

## 2018-12-17 ENCOUNTER — Other Ambulatory Visit: Payer: Self-pay

## 2018-12-17 DIAGNOSIS — Z79899 Other long term (current) drug therapy: Secondary | ICD-10-CM | POA: Insufficient documentation

## 2018-12-17 DIAGNOSIS — R1011 Right upper quadrant pain: Secondary | ICD-10-CM | POA: Diagnosis not present

## 2018-12-17 DIAGNOSIS — R42 Dizziness and giddiness: Secondary | ICD-10-CM | POA: Diagnosis not present

## 2018-12-17 DIAGNOSIS — R197 Diarrhea, unspecified: Secondary | ICD-10-CM | POA: Diagnosis not present

## 2018-12-17 DIAGNOSIS — U071 COVID-19: Secondary | ICD-10-CM | POA: Diagnosis not present

## 2018-12-17 DIAGNOSIS — R109 Unspecified abdominal pain: Secondary | ICD-10-CM | POA: Diagnosis present

## 2018-12-17 DIAGNOSIS — Z20822 Contact with and (suspected) exposure to covid-19: Secondary | ICD-10-CM

## 2018-12-17 LAB — COMPREHENSIVE METABOLIC PANEL
ALT: 14 U/L (ref 0–44)
AST: 17 U/L (ref 15–41)
Albumin: 4.4 g/dL (ref 3.5–5.0)
Alkaline Phosphatase: 48 U/L (ref 38–126)
Anion gap: 11 (ref 5–15)
BUN: 11 mg/dL (ref 6–20)
CO2: 22 mmol/L (ref 22–32)
Calcium: 9.2 mg/dL (ref 8.9–10.3)
Chloride: 104 mmol/L (ref 98–111)
Creatinine, Ser: 0.72 mg/dL (ref 0.44–1.00)
GFR calc Af Amer: >60 mL/min (ref >60)
GFR calc non Af Amer: >60 mL/min (ref >60)
Glucose, Bld: 102 mg/dL — ABNORMAL HIGH (ref 70–99)
Potassium: 3.8 mmol/L (ref 3.5–5.1)
Sodium: 137 mmol/L (ref 135–145)
Total Bilirubin: 0.6 mg/dL (ref 0.3–1.2)
Total Protein: 8.3 g/dL — ABNORMAL HIGH (ref 6.5–8.1)

## 2018-12-17 LAB — POCT PREGNANCY, URINE: Preg Test, Ur: NEGATIVE

## 2018-12-17 LAB — CBC
HCT: 38.9 % (ref 36.0–46.0)
Hemoglobin: 12.6 g/dL (ref 12.0–15.0)
MCH: 26.9 pg (ref 26.0–34.0)
MCHC: 32.4 g/dL (ref 30.0–36.0)
MCV: 83.1 fL (ref 80.0–100.0)
Platelets: 208 10*3/uL (ref 150–400)
RBC: 4.68 MIL/uL (ref 3.87–5.11)
RDW: 13.2 % (ref 11.5–15.5)
WBC: 9.5 10*3/uL (ref 4.0–10.5)
nRBC: 0 % (ref 0.0–0.2)

## 2018-12-17 LAB — URINALYSIS, COMPLETE (UACMP) WITH MICROSCOPIC
Bacteria, UA: NONE SEEN
Bilirubin Urine: NEGATIVE
Glucose, UA: NEGATIVE mg/dL
Hgb urine dipstick: NEGATIVE
Ketones, ur: NEGATIVE mg/dL
Leukocytes,Ua: NEGATIVE
Nitrite: NEGATIVE
Protein, ur: NEGATIVE mg/dL
Specific Gravity, Urine: 1.017 (ref 1.005–1.030)
pH: 5 (ref 5.0–8.0)

## 2018-12-17 LAB — SARS CORONAVIRUS 2 (TAT 6-24 HRS): SARS Coronavirus 2: POSITIVE — AB

## 2018-12-17 MED ORDER — LACTATED RINGERS IV BOLUS
1000.0000 mL | Freq: Once | INTRAVENOUS | Status: AC
  Administered 2018-12-17: 1000 mL via INTRAVENOUS

## 2018-12-17 MED ORDER — ACETAMINOPHEN 500 MG PO TABS
1000.0000 mg | ORAL_TABLET | Freq: Once | ORAL | Status: AC
  Administered 2018-12-17: 1000 mg via ORAL
  Filled 2018-12-17: qty 2

## 2018-12-17 MED ORDER — ONDANSETRON HCL 4 MG/2ML IJ SOLN
4.0000 mg | Freq: Once | INTRAMUSCULAR | Status: AC
  Administered 2018-12-17: 4 mg via INTRAVENOUS
  Filled 2018-12-17: qty 2

## 2018-12-17 MED ORDER — KETOROLAC TROMETHAMINE 30 MG/ML IJ SOLN
15.0000 mg | Freq: Once | INTRAMUSCULAR | Status: AC
  Administered 2018-12-17: 15 mg via INTRAVENOUS
  Filled 2018-12-17: qty 1

## 2018-12-17 MED ORDER — ONDANSETRON 4 MG PO TBDP: 4.0000 mg | ORAL_TABLET | Freq: Three times a day (TID) | ORAL | 0 refills | Status: AC | PRN

## 2018-12-17 NOTE — ED Provider Notes (Signed)
[facility] Emergency Department Provider Note   ____________________________________________   First MD Initiated Contact with Patient 12/17/18 [phone]     (approximate)  I have reviewed the triage vital signs and the nursing notes.   HISTORY  Chief Complaint Abdominal Pain    HPI Michelle Rowland is a 33 y.o. female with past medical history of anxiety and migraines presents to the ED complaining of abdominal pain.  Patient reports that she has been feeling sick and malaise for about the past week with subjective fevers and chills.  She denies any respiratory symptoms but has been feeling nauseous with a poor appetite and some diarrhea for the past 2 days.  Last night, she developed pain in the right upper quadrant of her abdomen that seems to move around to the right side.  She has not had any vomiting but states pain has been ongoing since last night.  She denies eating prior to onset of symptoms.  She does report a history of similar abdominal pain but denies any abdominal surgical history.  She denies any dysuria or hematuria.        Past Medical History:  Diagnosis Date  . Anxiety   . Anxiety   . Depression   . Migraines   . Multiple gastric ulcers   . Pelvic pain     There are no active problems to display for this patient.   Past Surgical History:  Procedure Laterality Date  . TONSILLECTOMY    . WISDOM TOOTH EXTRACTION  2009   4 teeth    Prior to Admission medications   Medication Sig Start Date End Date Taking? Authorizing Provider  azithromycin (ZITHROMAX Z-PAK) 250 MG tablet Take 2 tablets (500 mg) on  Day 1,  followed by 1 tablet (250 mg) once daily on Days 2 through 5. 08/29/18   Letitia Neri L, PA-C  brompheniramine-pseudoephedrine-DM 30-2-10 MG/5ML syrup Take 10 mLs by mouth 4 (four) times daily as needed. 12/10/18   Cuthriell, Charline Bills, PA-C  Cetirizine HCl 10 MG CAPS Take 1 capsule (10 mg total) by mouth once. Patient taking  differently: Take 10 mg by mouth daily as needed.  03/03/15   Johnn Hai, PA-C  clonazePAM (KLONOPIN) 0.5 MG disintegrating tablet Take 0.5 mg by mouth 3 (three) times daily as needed. For anxiety 09/24/14   [provider]  cyclobenzaprine (FLEXERIL) 5 MG tablet Take 1 tablet (5 mg total) by mouth 3 (three) times daily as needed for muscle spasms. 09/04/17   Harvest Dark, MD  diphenhydrAMINE (BENADRYL) 25 mg capsule Take 1 capsule (25 mg total) by mouth every 6 (six) hours as needed. 12/08/15   Carrie Mew, MD  EPIPEN 2-PAK 0.3 MG/0.3ML SOAJ injection as needed. 02/24/15   [provider]  fluticasone (FLONASE) 50 MCG/ACT nasal spray Place 1 spray into both nostrils 2 (two) times daily. 12/10/18   Cuthriell, Charline Bills, PA-C  lidocaine (LIDODERM) 5 % Place 1 patch onto the skin every 12 (twelve) hours. Remove & Discard patch within 12 hours or as directed by MD 09/09/18 09/09/19  Blake Divine, MD  Magnesium 500 MG CAPS Take by mouth every morning.    [provider]  metoCLOPramide (REGLAN) 10 MG tablet Take 1 tablet (10 mg total) by mouth every 6 (six) hours as needed. 12/08/15   Carrie Mew, MD  Norethindrone Acetate-Ethinyl Estrad-FE (LOESTRIN 24 FE) 1-20 MG-MCG(24) tablet Take 1 tablet by mouth daily. 08/23/14   [provider]  ondansetron (  ZOFRAN ODT) 4 MG disintegrating tablet Take 1 tablet (4 mg total) by mouth every 8 (eight) hours as needed for nausea or vomiting. 12/17/18   Chesley NoonJessup, Cassadee Vanzandt, MD  predniSONE (DELTASONE) 10 MG tablet Take 6 tablets  today, on day 2 take 5 tablets, day 3 take 4 tablets, day 4 take 3 tablets, day 5 take  2 tablets and 1 tablet the last day 08/29/18   Tommi RumpsSummers, Rhonda L, PA-C  ranitidine (ZANTAC) 150 MG tablet Take 1 tablet (150 mg total) by mouth 2 (two) times daily. Patient taking differently: Take 150 mg by mouth 2 (two) times daily as needed.  03/03/15 03/02/16  Tommi RumpsSummers, Rhonda L, PA-C  riboflavin (VITAMIN B-2)  100 MG TABS tablet Take 100 mg by mouth every morning. 2 tablets    [provider]  traMADol (ULTRAM) 50 MG tablet Take 50 mg by mouth every 6 (six) hours as needed.    [provider]    Allergies Penicillins, Amoxicillin, and Cephalosporins  No family history on file.  Social History Social History   Tobacco Use  . Smoking status: Never Smoker  . Smokeless tobacco: Never Used  Substance Use Topics  . Alcohol use: No    Comment: 1 drink per year  . Drug use: No    Review of Systems  Constitutional: Positive for subjective fever/chills Eyes: No visual changes. ENT: No sore throat. Cardiovascular: Denies chest pain. Respiratory: Denies shortness of breath. Gastrointestinal: Positive for abdominal pain.  Positive for nausea and diarrhea, no vomiting.  No constipation. Genitourinary: Negative for dysuria. Musculoskeletal: Negative for back pain. Skin: Negative for rash. Neurological: Negative for headaches, focal weakness or numbness.  ____________________________________________   PHYSICAL EXAM:  VITAL SIGNS: ED Triage Vitals  Enc Vitals Group     BP 12/17/18 0305 115/67     Pulse Rate 12/17/18 0305 96     Resp 12/17/18 0305 16     Temp 12/17/18 0305 99.6 F (37.6 C)     Temp Source 12/17/18 0305 Oral     SpO2 12/17/18 0305 98 %     Weight 12/17/18 0307 230 lb (104.3 kg)     Height 12/17/18 0307 5\' 4"  (1.626 m)     Head Circumference --      Peak Flow --      Pain Score 12/17/18 0306 9     Pain Loc --      Pain Edu? --      Excl. in GC? --     Constitutional: Alert and oriented. Eyes: Conjunctivae are normal. Head: Atraumatic. Nose: No congestion/rhinnorhea. Mouth/Throat: Mucous membranes are moist. Neck: Normal ROM Cardiovascular: Normal rate, regular rhythm. Grossly normal heart sounds. Respiratory: Normal respiratory effort.  No retractions. Lungs CTAB. Gastrointestinal: Soft and tender to palpation in the right upper quadrant  with no rebound or guarding. No distention.  No CVA tenderness bilaterally. Genitourinary: deferred Musculoskeletal: No lower extremity tenderness nor edema. Neurologic:  Normal speech and language. No gross focal neurologic deficits are appreciated. Skin:  Skin is warm, dry and intact. No rash noted. Psychiatric: Mood and affect are normal. Speech and behavior are normal.  ____________________________________________   LABS (all labs ordered are listed, but only abnormal results are displayed)  Labs Reviewed  COMPREHENSIVE METABOLIC PANEL - Abnormal; Notable for the following components:      Result Value   Glucose, Bld 102 (*)    Total Protein 8.3 (*)    All other components within normal limits  URINALYSIS, COMPLETE (  UACMP) WITH MICROSCOPIC - Abnormal; Notable for the following components:   Color, Urine YELLOW (*)    APPearance CLEAR (*)    All other components within normal limits  SARS CORONAVIRUS 2 (TAT 6-24 HRS)  CBC  POCT PREGNANCY, URINE  POC URINE PREG, ED     PROCEDURES  Procedure(s) performed (including Critical Care):  Procedures   ____________________________________________   INITIAL IMPRESSION / ASSESSMENT AND PLAN / ED COURSE       33 year old female presents to the ED with 1 week of fatigue and malaise followed by onset of nausea and diarrhea with right upper quadrant abdominal pain since last night.  She has tenderness to her right upper quadrant on exam, will assess with right upper quadrant ultrasound.  LFTs were unremarkable, no reason to suspect pancreatitis.  No right lower quadrant tenderness to suggest appendicitis.  Doubt cardiac etiology of her symptoms.  Lower suspicion for COVID-19, while patient had an exposure recently, she did test negative earlier this month.  It may be worthwhile to repeat her Covid testing if work-up is unremarkable.  Pregnancy testing is negative and UA without evidence of infection.  Right upper quadrant ultrasound  is unremarkable, no evidence of gallstones or cholecystitis.  Patient reports symptoms improved, will perform Covid testing given her close contact.  Counseled patient to follow-up with her PCP and return to the ED for new or worsening symptoms.  Patient agrees with plan.      ____________________________________________   FINAL CLINICAL IMPRESSION(S) / ED DIAGNOSES  Final diagnoses:  RUQ pain  Diarrhea, unspecified type  Suspected COVID-19 virus infection     ED Discharge Orders         Ordered    ondansetron (ZOFRAN ODT) 4 MG disintegrating tablet  Every 8 hours PRN     12/17/18 1110           Note:  This document was prepared using Dragon voice recognition software and may include unintentional dictation errors.   Chesley Noon, MD 12/17/18 1120

## 2018-12-17 NOTE — ED Triage Notes (Addendum)
Pt states she has been sick with URI symptoms for a week. Pt's SO tested positive for COVID.  Pt states she has had 24 hours of diarrhea, abdominal pain. Pt complains of right lower quadrant pain. Pt denies fever today but states has had fever and loss of smell.

## 2018-12-17 NOTE — ED Notes (Addendum)
Pt states she got up to get a tissue and "fell" in lobby striking left parietal scalp. Pt states she now has a "severe" headache. md notified. Pt denies other injuries, fall was unwitnessed by staff. Vitals obtained post fall. Pt instructed not to stand without assist. Pt in wheelchair currently. No obvious trauma noted.

## 2018-12-17 NOTE — ED Notes (Signed)
Pt was found lying on lobby floor. pt was crying and holding head. Pt states " I was getting up to get a tissue and felt light headed". This tech and Mayra, EDT assisted pt onto wheelchair. Vitals taken. RN notified.

## 2018-12-17 NOTE — ED Notes (Signed)
Pt walking around lobby after being instructed not to get up without assist.

## 2018-12-18 ENCOUNTER — Telehealth: Payer: Self-pay | Admitting: Emergency Medicine

## 2018-12-18 NOTE — Telephone Encounter (Signed)
Called patient to assure she is aware of positive covid.  Gave her guidelines for isolation and quarantine.

## 2019-10-21 ENCOUNTER — Ambulatory Visit
Admission: EM | Admit: 2019-10-21 | Discharge: 2019-10-21 | Disposition: A | Discharge status: Home / Self Care | Payer: Medicaid Other | Attending: Family Medicine | Admitting: Family Medicine

## 2019-10-21 DIAGNOSIS — H65192 Other acute nonsuppurative otitis media, left ear: Secondary | ICD-10-CM | POA: Diagnosis not present

## 2019-10-21 DIAGNOSIS — B001 Herpesviral vesicular dermatitis: Secondary | ICD-10-CM | POA: Diagnosis not present

## 2019-10-21 DIAGNOSIS — J069 Acute upper respiratory infection, unspecified: Secondary | ICD-10-CM | POA: Diagnosis not present

## 2019-10-21 DIAGNOSIS — J209 Acute bronchitis, unspecified: Secondary | ICD-10-CM

## 2019-10-21 MED ORDER — VALACYCLOVIR HCL 1 G PO TABS: 1000.0000 mg | ORAL_TABLET | Freq: Three times a day (TID) | ORAL | 0 refills | Status: AC

## 2019-10-21 MED ORDER — ALBUTEROL SULFATE HFA 108 (90 BASE) MCG/ACT IN AERS: 2.0000 | INHALATION_SPRAY | RESPIRATORY_TRACT | 0 refills | Status: AC | PRN

## 2019-10-21 MED ORDER — PREDNISONE 10 MG (21) PO TBPK: ORAL_TABLET | Freq: Every day | ORAL | 0 refills | Status: AC

## 2019-10-21 MED ORDER — SULFAMETHOXAZOLE-TRIMETHOPRIM 800-160 MG PO TABS: 1.0000 | ORAL_TABLET | Freq: Two times a day (BID) | ORAL | 0 refills | Status: AC

## 2019-10-21 NOTE — ED Provider Notes (Signed)
[facility]   [id number] 10/21/19 Arrival Time: 1420  CC: EAR PAIN  SUBJECTIVE: History from: patient.  Michelle Rowland is a 34 y.o. female who presents with of bilateral ear pain for the last week.  Also reports cough that is worse at night.  Reports that she is having chest tightness and pain with her cough.  Reports that she had Covid earlier in the year, and that when she gets sick she has the recurrent chest pain that she had with Covid. Denies a precipitating event, such as swimming or wearing ear plugs. Patient states the pain is constant and achy in character. Patient has taken TheraFlu and NyQuil for her symptoms with temporary relief. Symptoms are made worse with lying down. Denies similar symptoms in the past. Denies fever, chills, fatigue, sinus pain, rhinorrhea, ear discharge, sore throat, SOB, wheezing, chest pain, nausea, changes in bowel or bladder habits.    Also reports that she is getting a fever blister to right lower lip.  Reports that this occurs when she gets sick.  Has not attempted any OTC treatment for this.  ROS: As per HPI.  All other pertinent ROS negative.     Past Medical History:  Diagnosis Date  . Anxiety   . Anxiety   . Depression   . Migraines   . Multiple gastric ulcers   . Pelvic pain    Past Surgical History:  Procedure Laterality Date  . TONSILLECTOMY    . WISDOM TOOTH EXTRACTION  2009   4 teeth   Allergies  Allergen Reactions  . Penicillins Anaphylaxis, Swelling and Other (See Comments)    Has patient had a PCN reaction causing immediate rash, facial/tongue/throat swelling, SOB or lightheadedness with hypotension: Yes Has patient had a PCN reaction causing severe rash involving mucus membranes or skin necrosis: No Has patient had a PCN reaction that required hospitalization Yes Has patient had a PCN reaction occurring within the last 10 years: Yes If all of the above answers are "NO", then may proceed with Cephalosporin use.  Marland Kitchen  Amoxicillin Swelling  . Cephalosporins Swelling and Other (See Comments)   No current facility-administered medications on file prior to encounter.   Current Outpatient Medications on File Prior to Encounter  Medication Sig Dispense Refill  . azithromycin (ZITHROMAX Z-PAK) 250 MG tablet Take 2 tablets (500 mg) on  Day 1,  followed by 1 tablet (250 mg) once daily on Days 2 through 5. 6 each 0  . brompheniramine-pseudoephedrine-DM 30-2-10 MG/5ML syrup Take 10 mLs by mouth 4 (four) times daily as needed. 200 mL 0  . Cetirizine HCl 10 MG CAPS Take 1 capsule (10 mg total) by mouth once. (Patient taking differently: Take 10 mg by mouth daily as needed. ) 30 capsule 1  . clonazePAM (KLONOPIN) 0.5 MG disintegrating tablet Take 0.5 mg by mouth 3 (three) times daily as needed. For anxiety  1  . cyclobenzaprine (FLEXERIL) 5 MG tablet Take 1 tablet (5 mg total) by mouth 3 (three) times daily as needed for muscle spasms. 30 tablet 0  . diphenhydrAMINE (BENADRYL) 25 mg capsule Take 1 capsule (25 mg total) by mouth every 6 (six) hours as needed. 60 capsule 0  . EPIPEN 2-PAK 0.3 MG/0.3ML SOAJ injection as needed.    . fluticasone (FLONASE) 50 MCG/ACT nasal spray Place 1 spray into both nostrils 2 (two) times daily. 16 g 0  . Magnesium 500 MG CAPS Take by mouth every morning.    . metoCLOPramide (REGLAN) 10  MG tablet Take 1 tablet (10 mg total) by mouth every 6 (six) hours as needed. 30 tablet 0  . Norethindrone Acetate-Ethinyl Estrad-FE (LOESTRIN 24 FE) 1-20 MG-MCG(24) tablet Take 1 tablet by mouth daily.  9  . ondansetron (ZOFRAN ODT) 4 MG disintegrating tablet Take 1 tablet (4 mg total) by mouth every 8 (eight) hours as needed for nausea or vomiting. 12 tablet 0  . ranitidine (ZANTAC) 150 MG tablet Take 1 tablet (150 mg total) by mouth 2 (two) times daily. (Patient taking differently: Take 150 mg by mouth 2 (two) times daily as needed. ) 60 tablet 1  . riboflavin (VITAMIN B-2) 100 MG TABS tablet Take 100 mg by  mouth every morning. 2 tablets    . traMADol (ULTRAM) 50 MG tablet Take 50 mg by mouth every 6 (six) hours as needed.     Social History   Socioeconomic History  . Marital status: Single    Spouse name: Not on file  . Number of children: Not on file  . Years of education: Not on file  . Highest education level: Not on file  Occupational History  . Not on file  Tobacco Use  . Smoking status: Never Smoker  . Smokeless tobacco: Never Used  Substance and Sexual Activity  . Alcohol use: No    Comment: 1 drink per year  . Drug use: No  . Sexual activity: Not on file  Other Topics Concern  . Not on file  Social History Narrative  . Not on file   Social Determinants of Health   Financial Resource Strain:   . Difficulty of Paying Living Expenses: Not on file  Food Insecurity:   . Worried About Programme researcher, broadcasting/film/video in the Last Year: Not on file  . Ran Out of Food in the Last Year: Not on file  Transportation Needs:   . Lack of Transportation (Medical): Not on file  . Lack of Transportation (Non-Medical): Not on file  Physical Activity:   . Days of Exercise per Week: Not on file  . Minutes of Exercise per Session: Not on file  Stress:   . Feeling of Stress : Not on file  Social Connections:   . Frequency of Communication with Friends and Family: Not on file  . Frequency of Social Gatherings with Friends and Family: Not on file  . Attends Religious Services: Not on file  . Active Member of Clubs or Organizations: Not on file  . Attends Banker Meetings: Not on file  . Marital Status: Not on file  Intimate Partner Violence:   . Fear of Current or Ex-Partner: Not on file  . Emotionally Abused: Not on file  . Physically Abused: Not on file  . Sexually Abused: Not on file   History reviewed. No pertinent family history.  OBJECTIVE:  Vitals:   10/21/19 1521  BP: 115/79  Pulse: 90  Resp: 14  Temp: 98.3 F (36.8 C)  SpO2: 99%     General appearance: alert;  appears fatigued HEENT: Ears: EACs clear, R TM pearly gray with visible cone of light, without erythema  TM erythematous, bulging, with effusion; Eyes: PERRL, EOMI grossly; Sinuses nontender to palpation; Nose: clear rhinorrhea; Throat: oropharynx mildly erythematous, tonsils 1+ without white tonsillar exudates, uvula midline Neck: supple without LAD Lungs: unlabored respirations, symmetrical air entry; cough: mild; no respiratory distress Heart: regular rate and rhythm.  Radial pulses 2+ symmetrical bilaterally Skin: warm and dry, vesicular cluster noted to right lower lip  Psychological: alert and cooperative; normal mood and affect  Imaging: No results found.   ASSESSMENT & PLAN:  1. Other non-recurrent acute nonsuppurative otitis media of left ear   2. Acute bronchitis, unspecified organism   3. Upper respiratory tract infection, unspecified type   4. Cold sore     Meds ordered this encounter  Medications  . sulfamethoxazole-trimethoprim (BACTRIM DS) 800-160 MG tablet    Sig: Take 1 tablet by mouth 2 (two) times daily for 7 days.    Dispense:  14 tablet    Refill:  0    Order Specific Question:   Supervising Provider    Answer:   Merrilee Jansky [id number]  . predniSONE (STERAPRED UNI-PAK 21 TAB) 10 MG (21) TBPK tablet    Sig: Take by mouth daily for 6 days. Take 6 tablets on day 1, 5 tablets on day 2, 4 tablets on day 3, 3 tablets on day 4, 2 tablets on day 5, 1 tablet on day 6    Dispense:  21 tablet    Refill:  0    Order Specific Question:   Supervising Provider    Answer:   Merrilee Jansky [id number]  . albuterol (VENTOLIN HFA) 108 (90 Base) MCG/ACT inhaler    Sig: Inhale 2 puffs into the lungs every 4 (four) hours as needed for wheezing or shortness of breath.    Dispense:  18 g    Refill:  0    Order Specific Question:   Supervising Provider    Answer:   Merrilee Jansky [id number]  . valACYclovir (VALTREX) 1000 MG tablet    Sig: Take 1 tablet (1,000 mg total) by  mouth 3 (three) times daily for 7 days.    Dispense:  21 tablet    Refill:  0    Order Specific Question:   Supervising Provider    Answer:   Merrilee Jansky [[id number]]    Rest and drink plenty of fluids Prescribed albuterol Prescribed prednisone taper Prescribed Bactrim  Prescribed Valtrex for cold sore Take medications as directed and to completion Continue to use OTC ibuprofen and/ or tylenol as needed for pain control Follow up with PCP if symptoms persists Return here or go to the ER if you have any new or worsening symptoms   Reviewed expectations re: course of current medical issues. Questions answered. Outlined signs and symptoms indicating need for more acute intervention. Patient verbalized understanding. After Visit Summary given.         Moshe Cipro, NP 10/21/19 [phone]

## 2019-10-21 NOTE — ED Triage Notes (Signed)
Patient c/o bilateral ear pain and chest congestion. Reports she has a cough that is worse in the mornings and at nighttime.

## 2019-10-21 NOTE — Discharge Instructions (Signed)
I am going to treat you with Bactrim for your ear infection.  Take this medication twice a day for 10 days.  Have sent in an albuterol inhaler for you to use for shortness of breath, cough, wheezing, chest tightness.  I have sent in a prednisone taper for you to take for 6 days. 6 tablets on day one, 5 tablets on day two, 4 tablets on day three, 3 tablets on day four, 2 tablets on day five, and 1 tablet on day six.  Follow-up with ear nose and throat doctor if you experience another issue with your ears, another sinus infection, other concerning symptoms  Follow-up with the ER for trouble breathing, trouble swallowing, other concerning symptoms

## 2019-10-23 LAB — NOVEL CORONAVIRUS, NAA: SARS-CoV-2, NAA: NOT DETECTED

## 2019-10-23 LAB — SARS-COV-2, NAA 2 DAY TAT

## 2019-10-29 ENCOUNTER — Ambulatory Visit
Admission: EM | Admit: 2019-10-29 | Discharge: 2019-10-29 | Disposition: A | Discharge status: Home / Self Care | Payer: Medicaid Other | Attending: Emergency Medicine | Admitting: Emergency Medicine

## 2019-10-29 ENCOUNTER — Other Ambulatory Visit: Payer: Self-pay

## 2019-10-29 DIAGNOSIS — H6692 Otitis media, unspecified, left ear: Secondary | ICD-10-CM | POA: Diagnosis not present

## 2019-10-29 DIAGNOSIS — J0100 Acute maxillary sinusitis, unspecified: Secondary | ICD-10-CM

## 2019-10-29 MED ORDER — DOXYCYCLINE HYCLATE 100 MG PO CAPS: 100.0000 mg | ORAL_CAPSULE | Freq: Two times a day (BID) | ORAL | 0 refills | Status: DC

## 2019-10-29 NOTE — ED Triage Notes (Signed)
Patient here following up from recent visit. Reports that she has finished the bactrim but does not feel any better. Reports the congestion is getting worse and states she is now coughing up thick yellow phlegm.

## 2019-10-29 NOTE — Discharge Instructions (Addendum)
Take the doxycycline as directed.    Schedule a follow-up appointment with your primary care provider for later this week or early next week.

## 2019-10-29 NOTE — ED Provider Notes (Signed)
Renaldo Fiddler    CSN: [id number] Arrival date & time: 10/29/19  3614      History   Chief Complaint Chief Complaint  Patient presents with  . Nasal Congestion  . chest congestion  . Cough  . Otalgia    HPI Michelle Rowland is a 34 y.o. female.   Patient presents with ongoing ear pain, cough, congestion.  She was seen here by NP Ashley Royalty on 10/21/2019; diagnosed with left otitis media, acute bronchitis, URI, cold sore; treated with Bactrim DS, prednisone, albuterol MDI, Valtrex.  She states she finished the medication that was prescribed.  Her cough is productive of thick yellow phlegm.  She denies fever, chills, shortness of breath, vomiting, diarrhea, or other symptoms.  The history is provided by the patient and medical records.    Past Medical History:  Diagnosis Date  . Anxiety   . Anxiety   . Depression   . Migraines   . Multiple gastric ulcers   . Pelvic pain     There are no problems to display for this patient.   Past Surgical History:  Procedure Laterality Date  . TONSILLECTOMY    . WISDOM TOOTH EXTRACTION  2009   4 teeth    OB History   No obstetric history on file.      Home Medications    Prior to Admission medications   Medication Sig Start Date End Date Taking? Authorizing Provider  albuterol (VENTOLIN HFA) 108 (90 Base) MCG/ACT inhaler Inhale 2 puffs into the lungs every 4 (four) hours as needed for wheezing or shortness of breath. 10/21/19   Moshe Cipro, NP  azithromycin (ZITHROMAX Z-PAK) 250 MG tablet Take 2 tablets (500 mg) on  Day 1,  followed by 1 tablet (250 mg) once daily on Days 2 through 5. 08/29/18   Tommi Rumps, PA-C  brompheniramine-pseudoephedrine-DM 30-2-10 MG/5ML syrup Take 10 mLs by mouth 4 (four) times daily as needed. 12/10/18   Cuthriell, Delorise Royals, PA-C  Cetirizine HCl 10 MG CAPS Take 1 capsule (10 mg total) by mouth once. Patient taking differently: Take 10 mg by mouth daily as needed.  03/03/15   Tommi Rumps, PA-C  clonazePAM (KLONOPIN) 0.5 MG disintegrating tablet Take 0.5 mg by mouth 3 (three) times daily as needed. For anxiety 09/24/14   [provider]  cyclobenzaprine (FLEXERIL) 5 MG tablet Take 1 tablet (5 mg total) by mouth 3 (three) times daily as needed for muscle spasms. 09/04/17   Minna Antis, MD  diphenhydrAMINE (BENADRYL) 25 mg capsule Take 1 capsule (25 mg total) by mouth every 6 (six) hours as needed. 12/08/15   Sharman Cheek, MD  doxycycline (VIBRAMYCIN) 100 MG capsule Take 1 capsule (100 mg total) by mouth 2 (two) times daily. 10/29/19   Mickie Bail, NP  EPIPEN 2-PAK 0.3 MG/0.3ML SOAJ injection as needed. 02/24/15   [provider]  fluticasone (FLONASE) 50 MCG/ACT nasal spray Place 1 spray into both nostrils 2 (two) times daily. 12/10/18   Cuthriell, Delorise Royals, PA-C  Magnesium 500 MG CAPS Take by mouth every morning.    [provider]  metoCLOPramide (REGLAN) 10 MG tablet Take 1 tablet (10 mg total) by mouth every 6 (six) hours as needed. 12/08/15   Sharman Cheek, MD  Norethindrone Acetate-Ethinyl Estrad-FE (LOESTRIN 24 FE) 1-20 MG-MCG(24) tablet Take 1 tablet by mouth daily. 08/23/14   [provider]  ondansetron (ZOFRAN ODT) 4 MG disintegrating tablet Take 1 tablet (4 mg total) by  mouth every 8 (eight) hours as needed for nausea or vomiting. 12/17/18   Chesley Noon, MD  ranitidine (ZANTAC) 150 MG tablet Take 1 tablet (150 mg total) by mouth 2 (two) times daily. Patient taking differently: Take 150 mg by mouth 2 (two) times daily as needed.  03/03/15 03/02/16  Tommi Rumps, PA-C  riboflavin (VITAMIN B-2) 100 MG TABS tablet Take 100 mg by mouth every morning. 2 tablets    [provider]  traMADol (ULTRAM) 50 MG tablet Take 50 mg by mouth every 6 (six) hours as needed.    [provider]    Family History History reviewed. No pertinent family history.  Social History Social History   Tobacco Use  .  Smoking status: Never Smoker  . Smokeless tobacco: Never Used  Substance Use Topics  . Alcohol use: No    Comment: 1 drink per year  . Drug use: No     Allergies   Penicillins, Amoxicillin, and Cephalosporins   Review of Systems Review of Systems  Constitutional: Negative for chills and fever.  HENT: Positive for congestion and ear pain. Negative for sore throat.   Eyes: Negative for pain and visual disturbance.  Respiratory: Positive for cough. Negative for shortness of breath.   Cardiovascular: Negative for chest pain and palpitations.  Gastrointestinal: Negative for abdominal pain, diarrhea and vomiting.  Genitourinary: Negative for dysuria and hematuria.  Musculoskeletal: Negative for arthralgias and back pain.  Skin: Negative for color change and rash.  Neurological: Negative for seizures and syncope.  All other systems reviewed and are negative.    Physical Exam Triage Vital Signs ED Triage Vitals  Enc Vitals Group     BP      Pulse      Resp      Temp      Temp src      SpO2      Weight      Height      Head Circumference      Peak Flow      Pain Score      Pain Loc      Pain Edu?      Excl. in GC?    No data found.  Updated Vital Signs BP 107/76   Pulse 85   Temp 98.9 F (37.2 C)   Resp 19   LMP 10/07/2019 (Exact Date)   SpO2 98%   Visual Acuity Right Eye Distance:   Left Eye Distance:   Bilateral Distance:    Right Eye Near:   Left Eye Near:    Bilateral Near:     Physical Exam Vitals and nursing note reviewed.  Constitutional:      General: She is not in acute distress.    Appearance: She is well-developed.  HENT:     Head: Normocephalic and atraumatic.     Right Ear: Tympanic membrane normal.     Left Ear: Tympanic membrane is erythematous.     Nose: Congestion and rhinorrhea present.     Mouth/Throat:     Mouth: Mucous membranes are moist.     Pharynx: Oropharynx is clear.  Eyes:     Conjunctiva/sclera: Conjunctivae normal.   Cardiovascular:     Rate and Rhythm: Normal rate and regular rhythm.     Heart sounds: No murmur heard.   Pulmonary:     Effort: Pulmonary effort is normal. No respiratory distress.     Breath sounds: Normal breath sounds.  Abdominal:  Palpations: Abdomen is soft.     Tenderness: There is no abdominal tenderness. There is no guarding or rebound.  Musculoskeletal:     Cervical back: Neck supple.  Skin:    General: Skin is warm and dry.     Findings: No rash.  Neurological:     General: No focal deficit present.     Mental Status: She is alert and oriented to person, place, and time.     Gait: Gait normal.  Psychiatric:        Mood and Affect: Mood normal.        Behavior: Behavior normal.      UC Treatments / Results  Labs (all labs ordered are listed, but only abnormal results are displayed) Labs Reviewed - No data to display  EKG   Radiology No results found.  Procedures Procedures (including critical care time)  Medications Ordered in UC Medications - No data to display  Initial Impression / Assessment and Plan / UC Course  I have reviewed the triage vital signs and the nursing notes.  Pertinent labs & imaging results that were available during my care of the patient were reviewed by me and considered in my medical decision making (see chart for details).   Left otitis media, acute sinusitis.  Treating with 10-day course of doxycycline.  Instructed patient to schedule an appointment with her PCP for later this week or early next week for recheck.  Patient agrees to plan of care.   Final Clinical Impressions(s) / UC Diagnoses   Final diagnoses:  Left otitis media, unspecified otitis media type  Acute non-recurrent maxillary sinusitis     Discharge Instructions     Take the doxycycline as directed.    Schedule a follow-up appointment with your primary care provider for later this week or early next week.        ED Prescriptions    Medication  Sig Dispense Auth. Provider   doxycycline (VIBRAMYCIN) 100 MG capsule Take 1 capsule (100 mg total) by mouth 2 (two) times daily. 20 capsule Mickie Bail, NP     PDMP not reviewed this encounter.   Mickie Bail, NP 10/29/19 [phone]

## 2020-12-14 DIAGNOSIS — F331 Major depressive disorder, recurrent, moderate: Secondary | ICD-10-CM | POA: Insufficient documentation

## 2020-12-14 DIAGNOSIS — F411 Generalized anxiety disorder: Secondary | ICD-10-CM | POA: Insufficient documentation

## 2021-01-28 DIAGNOSIS — I480 Paroxysmal atrial fibrillation: Secondary | ICD-10-CM | POA: Insufficient documentation

## 2021-03-17 DIAGNOSIS — G4733 Obstructive sleep apnea (adult) (pediatric): Secondary | ICD-10-CM | POA: Insufficient documentation

## 2021-06-07 ENCOUNTER — Ambulatory Visit (INDEPENDENT_AMBULATORY_CARE_PROVIDER_SITE_OTHER): Payer: Medicaid Other | Admitting: Internal Medicine

## 2021-06-07 VITALS — BP 111/73 | HR 74 | Resp 16 | Ht 64.0 in | Wt 230.0 lb

## 2021-06-07 DIAGNOSIS — Z9989 Dependence on other enabling machines and devices: Secondary | ICD-10-CM

## 2021-06-07 DIAGNOSIS — G4733 Obstructive sleep apnea (adult) (pediatric): Secondary | ICD-10-CM

## 2021-06-07 DIAGNOSIS — I480 Paroxysmal atrial fibrillation: Secondary | ICD-10-CM | POA: Diagnosis not present

## 2021-06-07 DIAGNOSIS — Z7189 Other specified counseling: Secondary | ICD-10-CM

## 2021-06-07 NOTE — Progress Notes (Signed)
[facility] ?[address] ?[place], [place] [address] ? ?Pulmonary Sleep Medicine  ? ?Office Visit Note ? ?Patient Name: Michelle Rowland ?DOB: 04-02-1985 ?MRN [id number] ? ? ? ?Chief Complaint: Obstructive Sleep Apnea visit ? ?Brief History: ? ?Keeara is seen today for follow up to review compliance. The patient has a 2 year history of sleep apnea symptoms of moderate snoring, observed apneas, morning headaches, moodiness with difficulty focusing.  Patient said she came for initial evaluation because of having A-fib.. Patient is using PAP nightly  but hours are  are not compliant.  The patient feels better after sleeping with PAP but said she does not use it well due to anxiety and full face mask discomfort with the small  F20 and prefer to try nasal mask..  The patient reports benefits from PAP use. and Epworth Sleepiness Score is 2 out of 24. The patient does not take naps. The patient complains of the following: wants different mask.  The compliance download shows  compliance with an average use time of 3:53 hours@ 43%. The AHI is 0.6  The patient does not complain of limb movements disrupting sleep. ? ?ROS ? ?General: (-) fever, (-) chills, (-) night sweat ?Nose and Sinuses: (-) nasal stuffiness or itchiness, (-) postnasal drip, (-) nosebleeds, (-) sinus trouble. ?Mouth and Throat: (-) sore throat, (-) hoarseness. ?Neck: (-) swollen glands, (-) enlarged thyroid, (-) neck pain. ?Respiratory: + cough, - shortness of breath, - wheezing. ?Neurologic: - numbness, - tingling. ?Psychiatric: - anxiety, - depression ? ? ?Current Medication: ?Outpatient Encounter Medications as of 06/07/2021  ?Medication Sig  ? metFORMIN (GLUCOPHAGE-XR) 500 MG 24 hr tablet Take by mouth.  ? albuterol (VENTOLIN HFA) 108 (90 Base) MCG/ACT inhaler Inhale 2 puffs into the lungs every 4 (four) hours as needed for wheezing or shortness of breath.  ? Azelastine HCl 137 MCG/SPRAY SOLN Place 2 sprays into both nostrils 2 (two) times  daily.  ? azithromycin (ZITHROMAX Z-PAK) 250 MG tablet Take 2 tablets (500 mg) on  Day 1,  followed by 1 tablet (250 mg) once daily on Days 2 through 5.  ? brompheniramine-pseudoephedrine-DM 30-2-10 MG/5ML syrup Take 10 mLs by mouth 4 (four) times daily as needed.  ? cetirizine (ZYRTEC) 10 MG tablet Take 10 mg by mouth daily.  ? Cetirizine HCl 10 MG CAPS Take 1 capsule (10 mg total) by mouth once. (Patient taking differently: Take 10 mg by mouth daily as needed. )  ? clonazePAM (KLONOPIN) 0.5 MG disintegrating tablet Take 0.5 mg by mouth 3 (three) times daily as needed. For anxiety  ? cyclobenzaprine (FLEXERIL) 5 MG tablet Take 1 tablet (5 mg total) by mouth 3 (three) times daily as needed for muscle spasms.  ? diltiazem (CARDIZEM CD) 240 MG 24 hr capsule Take 240 mg by mouth daily.  ? diphenhydrAMINE (BENADRYL) 25 mg capsule Take 1 capsule (25 mg total) by mouth every 6 (six) hours as needed.  ? doxycycline (VIBRAMYCIN) 100 MG capsule Take 1 capsule (100 mg total) by mouth 2 (two) times daily.  ? EPIPEN 2-PAK 0.3 MG/0.3ML SOAJ injection as needed.  ? fluticasone (FLONASE) 50 MCG/ACT nasal spray Place 1 spray into both nostrils 2 (two) times daily.  ? Magnesium 500 MG CAPS Take by mouth every morning.  ? metoCLOPramide (REGLAN) 10 MG tablet Take 1 tablet (10 mg total) by mouth every 6 (six) hours as needed.  ? Norethindrone Acetate-Ethinyl Estrad-FE (LOESTRIN 24 FE) 1-20 MG-MCG(24) tablet Take 1 tablet by mouth daily.  ?  ondansetron (ZOFRAN ODT) 4 MG disintegrating tablet Take 1 tablet (4 mg total) by mouth every 8 (eight) hours as needed for nausea or vomiting.  ? ranitidine (ZANTAC) 150 MG tablet Take 1 tablet (150 mg total) by mouth 2 (two) times daily. (Patient taking differently: Take 150 mg by mouth 2 (two) times daily as needed. )  ? riboflavin (VITAMIN B-2) 100 MG TABS tablet Take 100 mg by mouth every morning. 2 tablets  ? topiramate (TOPAMAX) 25 MG tablet Take 25 mg by mouth at bedtime.  ? traMADol  (ULTRAM) 50 MG tablet Take 50 mg by mouth every 6 (six) hours as needed.  ? ?No facility-administered encounter medications on file as of 06/07/2021.  ? ? ?Surgical History: ?Past Surgical History:  ?Procedure Laterality Date  ? TONSILLECTOMY    ? [place] EXTRACTION  2009  ? 4 teeth  ? ? ?Medical History: ?Past Medical History:  ?Diagnosis Date  ? Anxiety   ? Anxiety   ? Depression   ? Migraines   ? Multiple gastric ulcers   ? Pelvic pain   ? ? ?Family History: ?Non contributory to the present illness ? ?Social History: ?Social History  ? ?Socioeconomic History  ? Marital status: Single  ?  Spouse name: Not on file  ? Number of children: Not on file  ? Years of education: Not on file  ? Highest education level: Not on file  ?Occupational History  ? Not on file  ?Tobacco Use  ? Smoking status: Never  ? Smokeless tobacco: Never  ?Substance and Sexual Activity  ? Alcohol use: No  ?  Comment: 1 drink per year  ? Drug use: No  ? Sexual activity: Not on file  ?Other Topics Concern  ? Not on file  ?Social History Narrative  ? Not on file  ? ?Social Determinants of Health  ? ?Financial Resource Strain: Not on file  ?Food Insecurity: Not on file  ?Transportation Needs: Not on file  ?Physical Activity: Not on file  ?Stress: Not on file  ?Social Connections: Not on file  ?Intimate Partner Violence: Not on file  ? ? ?Vital Signs: ?Blood pressure 111/73, pulse 74, resp. rate 16, height 5\' 4"  (1.626 m), weight 230 lb (104.3 kg), SpO2 100 %. ?Body mass index is 39.48 kg/m?.  ? ? ?Examination: ?General Appearance: The patient is well-developed, well-nourished, and in no distress. ?Neck Circumference: 40 cm ?Skin: Gross inspection of skin unremarkable. ?Head: normocephalic, no gross deformities. ?Eyes: no gross deformities noted. ?ENT: ears appear grossly normal ?Neurologic: Alert and oriented. No involuntary movements. ? ? ? ?EPWORTH SLEEPINESS SCALE: ? ?Scale:  ?(0)= no chance of dozing; (1)= slight chance of dozing; (2)=  moderate chance of dozing; (3)= high chance of dozing ? ?Chance  Situtation ?   ?Sitting and reading: 0 ?  ? Watching TV: 0 ?   ?Sitting Inactive in public: 0 ?   ?As a passenger in car: 1   ?   ?Lying down to rest: 0 ?   ?Sitting and talking: 0 ?   ?Sitting quielty after lunch: 1 ?   ?In a car, stopped in traffic: 0 ? ? ?TOTAL SCORE:   2 out of 24 ? ? ? ?SLEEP STUDIES: ? ?PSG 03/16/14 - AHI 13.4, REM AHI 33.8,  Supine AHI 42.3,  Low SpO2 41% ? ? ?CPAP COMPLIANCE DATA: ? ?Date Range: 05/02/21 -  ? ?Average Daily Use: 3:53 hours ? ?Median Use: 3:52 ? ?Compliance for > 4 Hours:  43% days ? ?AHI: 0.6 respiratory events per hour ? ?Days Used: 28/30 ? ?Mask Leak: 13.7 lpm ? ?95th Percentile Pressure: 13 cmH2O ? ? ? ?LABS: ?No results found for this or any previous visit (from the past 2160 hour(s)). ? ?Radiology: ?No results found. ? ?No results found. ? ?No results found. ? ? ? ?Assessment and Plan: ?Patient Active Problem List  ? Diagnosis Date Noted  ? OSA (obstructive sleep apnea) 03/17/2021  ? Paroxysmal atrial fibrillation ([place]) 01/28/2021  ? GAD (generalized anxiety disorder) 12/14/2020  ? MDD (major depressive disorder), recurrent episode, moderate ([place]) 12/14/2020  ? ? ?1. OSA on CPAP ?The patient does tolerate PAP and reports  benefit from PAP use. The patient was reminded how to clean equipment and advised to replace supplies routinely. She will be set up for a mask fitting. . The patient was also counselled on weight loss. The compliance is poor. The AHI is 0.6. ? ? ?OSA- increase compliance with pap. Mask fitting. F/u 54m ? ?2. CPAP use counseling ?CPAP Counseling: had a lengthy discussion with the patient regarding the importance of PAP therapy in management of the sleep apnea. Patient appears to understand the risk factor reduction and also understands the risks associated with untreated sleep apnea. Patient will try to make a good faith effort to remain compliant with therapy. Also instructed the patient on  proper cleaning of the device including the water must be changed daily if possible and use of distilled water is preferred. Patient understands that the machine should be regularly cleaned with appropriate recomme

## 2021-06-07 NOTE — Patient Instructions (Signed)

## 2021-09-06 ENCOUNTER — Ambulatory Visit (INDEPENDENT_AMBULATORY_CARE_PROVIDER_SITE_OTHER): Payer: Medicaid Other | Admitting: Internal Medicine

## 2021-09-06 VITALS — BP 126/80 | HR 78 | Resp 14 | Ht 64.0 in | Wt 238.0 lb

## 2021-09-06 DIAGNOSIS — Z7189 Other specified counseling: Secondary | ICD-10-CM | POA: Diagnosis not present

## 2021-09-06 DIAGNOSIS — G4733 Obstructive sleep apnea (adult) (pediatric): Secondary | ICD-10-CM | POA: Diagnosis not present

## 2021-09-06 DIAGNOSIS — Z9989 Dependence on other enabling machines and devices: Secondary | ICD-10-CM

## 2021-09-06 NOTE — Progress Notes (Signed)
[facility] [address] [place], [place] [address]  Pulmonary Sleep Medicine   Office Visit Note  Patient Name: BERNEICE ZETTLEMOYER DOB: 06/20/1985 MRN [id number]    Chief Complaint: Obstructive Sleep Apnea visit  Brief History:  Reigan is seen today for a follow up visit for CPAP@ 13 cmH2O. The patient has a 2 year history of sleep apnea. Patient is using PAP nightly.  The patient feels rested after sleeping with PAP.  The patient reports benefiting from PAP use. Reported sleepiness is  improved and the Epworth Sleepiness Score is 1 out of 24. The patient does not take naps. The patient complains of the following: No complaints.  The compliance download shows 89% compliance with an average use time of 5 hours 39 minutes. The AHI is 0.7.  The patient does not complain of limb movements disrupting sleep. The patient continues to require PAP therapy as a medical necessity in order to eliminate her sleep apnea.   ROS  General: (-) fever, (-) chills, (-) night sweat Nose and Sinuses: (-) nasal stuffiness or itchiness, (-) postnasal drip, (-) nosebleeds, (-) sinus trouble. Mouth and Throat: (-) sore throat, (-) hoarseness. Neck: (-) swollen glands, (-) enlarged thyroid, (-) neck pain. Respiratory: - cough, - shortness of breath, - wheezing. Neurologic: - numbness, - tingling. Psychiatric: - anxiety, - depression   Current Medication: Outpatient Encounter Medications as of 09/06/2021  Medication Sig   albuterol (VENTOLIN HFA) 108 (90 Base) MCG/ACT inhaler Inhale 2 puffs into the lungs every 4 (four) hours as needed for wheezing or shortness of breath.   Azelastine HCl 137 MCG/SPRAY SOLN Place 2 sprays into both nostrils 2 (two) times daily.   brompheniramine-pseudoephedrine-DM 30-2-10 MG/5ML syrup Take 10 mLs by mouth 4 (four) times daily as needed.   cetirizine (ZYRTEC) 10 MG tablet Take 10 mg by mouth daily.   Cetirizine HCl 10 MG CAPS Take 1 capsule (10 mg total) by mouth  once. (Patient taking differently: Take 10 mg by mouth daily as needed. )   clonazePAM (KLONOPIN) 0.5 MG disintegrating tablet Take 0.5 mg by mouth 3 (three) times daily as needed. For anxiety   cyclobenzaprine (FLEXERIL) 5 MG tablet Take 1 tablet (5 mg total) by mouth 3 (three) times daily as needed for muscle spasms.   diltiazem (CARDIZEM CD) 240 MG 24 hr capsule Take 240 mg by mouth daily.   diphenhydrAMINE (BENADRYL) 25 mg capsule Take 1 capsule (25 mg total) by mouth every 6 (six) hours as needed.   EPIPEN 2-PAK 0.3 MG/0.3ML SOAJ injection as needed.   fluticasone (FLONASE) 50 MCG/ACT nasal spray Place 1 spray into both nostrils 2 (two) times daily.   Magnesium 500 MG CAPS Take by mouth every morning.   metFORMIN (GLUCOPHAGE-XR) 500 MG 24 hr tablet Take by mouth.   metoCLOPramide (REGLAN) 10 MG tablet Take 1 tablet (10 mg total) by mouth every 6 (six) hours as needed.   Norethindrone Acetate-Ethinyl Estrad-FE (LOESTRIN 24 FE) 1-20 MG-MCG(24) tablet Take 1 tablet by mouth daily.   ondansetron (ZOFRAN ODT) 4 MG disintegrating tablet Take 1 tablet (4 mg total) by mouth every 8 (eight) hours as needed for nausea or vomiting.   riboflavin (VITAMIN B-2) 100 MG TABS tablet Take 100 mg by mouth every morning. 2 tablets   topiramate (TOPAMAX) 25 MG tablet Take 25 mg by mouth at bedtime.   traMADol (ULTRAM) 50 MG tablet Take 50 mg by mouth every 6 (six) hours as needed.   [DISCONTINUED] azithromycin (  ZITHROMAX Z-PAK) 250 MG tablet Take 2 tablets (500 mg) on  Day 1,  followed by 1 tablet (250 mg) once daily on Days 2 through 5.   [DISCONTINUED] doxycycline (VIBRAMYCIN) 100 MG capsule Take 1 capsule (100 mg total) by mouth 2 (two) times daily.   [DISCONTINUED] ranitidine (ZANTAC) 150 MG tablet Take 1 tablet (150 mg total) by mouth 2 (two) times daily. (Patient taking differently: Take 150 mg by mouth 2 (two) times daily as needed. )   No facility-administered encounter medications on file as of  09/06/2021.    Surgical History: Past Surgical History:  Procedure Laterality Date   TONSILLECTOMY     WISDOM TOOTH EXTRACTION  2009   4 teeth    Medical History: Past Medical History:  Diagnosis Date   Anxiety    Anxiety    Depression    Migraines    Multiple gastric ulcers    Pelvic pain     Family History: Non contributory to the present illness  Social History: Social History   Socioeconomic History   Marital status: Single    Spouse name: Not on file   Number of children: Not on file   Years of education: Not on file   Highest education level: Not on file  Occupational History   Not on file  Tobacco Use   Smoking status: Never   Smokeless tobacco: Never  Substance and Sexual Activity   Alcohol use: No    Comment: 1 drink per year   Drug use: No   Sexual activity: Not on file  Other Topics Concern   Not on file  Social History Narrative   Not on file   Social Determinants of Health   Financial Resource Strain: Not on file  Food Insecurity: Not on file  Transportation Needs: Not on file  Physical Activity: Not on file  Stress: Not on file  Social Connections: Not on file  Intimate Partner Violence: Not on file    Vital Signs: Blood pressure 126/80, pulse 78, resp. rate 14, height 5\' 4"  (1.626 m), weight 238 lb (108 kg), SpO2 100 %. Body mass index is 40.85 kg/m.    Examination: General Appearance: The patient is well-developed, well-nourished, and in no distress. Neck Circumference: 38 cm Skin: Gross inspection of skin unremarkable. Head: normocephalic, no gross deformities. Eyes: no gross deformities noted. ENT: ears appear grossly normal Neurologic: Alert and oriented. No involuntary movements.  STOP BANG RISK ASSESSMENT S (snore) Have you been told that you snore?     NO   T (tired) Are you often tired, fatigued, or sleepy during the day?   NO  O (obstruction) Do you stop breathing, choke, or gasp during sleep? NO   P (pressure)  Do you have or are you being treated for high blood pressure? NO   B (BMI) Is your body index greater than 35 kg/m? YES   A (age) Are you 78 years old or older? NO   N (neck) Do you have a neck circumference greater than 16 inches?   NO   G (gender) Are you a female? NO   TOTAL STOP/BANG "YES" ANSWERS 1       A STOP-Bang score of 2 or less is considered low risk, and a score of 5 or more is high risk for having either moderate or severe OSA. For people who score 3 or 4, doctors may need to perform further assessment to determine how likely they are to have OSA.  EPWORTH SLEEPINESS SCALE:  Scale:  (0)= no chance of dozing; (1)= slight chance of dozing; (2)= moderate chance of dozing; (3)= high chance of dozing  Chance  Situtation    Sitting and reading: 0    Watching TV: 1    Sitting Inactive in public: 0    As a passenger in car: 0      Lying down to rest: 0    Sitting and talking: 0    Sitting quielty after lunch: 0    In a car, stopped in traffic: 0   TOTAL SCORE:   1 out of 24    SLEEP STUDIES:  PSG (02/2014) AHI 13.4/hr, REM AHI 33.8, Supine AHI 42.3, min SpO2 41% Titration (03/2021) CPAP@ 14 cmH2O   CPAP COMPLIANCE DATA:  Date Range: 05/25/2021-08/22/2021  Average Daily Use: 5 hours 39 minutes  Median Use: 5 hours 46 minutes  Compliance for > 4 Hours: 89%  AHI: 0.7 respiratory events per hour  Days Used: 89/90 days  Mask Leak: 17.2  95th Percentile Pressure: 13         LABS: No results found for this or any previous visit (from the past 2160 hour(s)).  Radiology: No results found.  No results found.  No results found.    Assessment and Plan: Patient Active Problem List   Diagnosis Date Noted   OSA on CPAP 09/06/2021   CPAP use counseling 09/06/2021   Morbid obesity (HCC) 09/06/2021   OSA (obstructive sleep apnea) 03/17/2021   Paroxysmal atrial fibrillation (HCC) 01/28/2021   GAD (generalized anxiety disorder)  12/14/2020   MDD (major depressive disorder), recurrent episode, moderate (HCC) 12/14/2020    1. OSA on CPAP The patient does  tolerate PAP and reports  benefit from PAP use. The patient was reminded how to clean equipment and advised to replace supplies routinely. The patient was also counselled on weight loss. The compliance is good. The AHI is 0.7.   OSA on cpap- CPAP continues to be medically necessary to treat this patient's OSA.  Continue with good compliance- f/u one year.   2. CPAP use counseling CPAP Counseling: had a lengthy discussion with the patient regarding the importance of PAP therapy in management of the sleep apnea. Patient appears to understand the risk factor reduction and also understands the risks associated with untreated sleep apnea. Patient will try to make a good faith effort to remain compliant with therapy. Also instructed the patient on proper cleaning of the device including the water must be changed daily if possible and use of distilled water is preferred. Patient understands that the machine should be regularly cleaned with appropriate recommended cleaning solutions that do not damage the PAP machine for example given white vinegar and water rinses. Other methods such as ozone treatment may not be as good as these simple methods to achieve cleaning.   3. Morbid obesity (HCC) Obesity Counseling: Had a lengthy discussion regarding patients BMI and weight issues. Patient was instructed on portion control as well as increased activity. Also discussed caloric restrictions with trying to maintain intake less than 2000 Kcal. Discussions were made in accordance with the 5As of weight management. Simple actions such as not eating late and if able to, taking a walk is suggested.    General Counseling: I have discussed the findings of the evaluation and examination with Deloise.  I have also discussed any further diagnostic evaluation thatmay be needed or ordered today. Taylan  verbalizes understanding of the findings of todays visit.  We also reviewed her medications today and discussed drug interactions and side effects including but not limited excessive drowsiness and altered mental states. We also discussed that there is always a risk not just to her but also people around her. she has been encouraged to call the office with any questions or concerns that should arise related to todays visit.  No orders of the defined types were placed in this encounter.       I have personally obtained a history, examined the patient, evaluated laboratory and imaging results, formulated the assessment and plan and placed orders. This patient was seen today by Emmaline Kluver, PA-C in collaboration with Dr. Freda Munro.   Yevonne Pax, MD [facility] Diplomate ABMS Pulmonary Critical Care Medicine and Sleep Medicine

## 2021-09-06 NOTE — Patient Instructions (Signed)

## 2022-09-02 NOTE — Progress Notes (Unsigned)
[facility] [address] [place], [place] [address]  Pulmonary Sleep Medicine   Office Visit Note  Patient Name: Michelle ORDOYNE DOB: March 06, 1985 MRN [id number]    Chief Complaint: Obstructive Sleep Apnea visit  Brief History:  Aletha is seen today for an annual follow up visit for CPAP@ 13 cmH2O. The patient has a 3 year history of sleep apnea. Patient is using PAP nightly.  The patient feels rested after sleeping with PAP.  The patient reports benefit from PAP use. Reported sleepiness is improved and the Epworth Sleepiness Score is 5 out of 24. The patient does not take naps. The patient complains of the following: wishes mask was more comfortable but satisfied in current mask. We discussed also adjusting humidity for the oral dryness she's experiencing. The compliance download shows 99 % compliance with an average use time of 7 hours 25 minutes. The AHI is 0.9. The patient *** of limb movements disrupting sleep. The patient continues to require PAP therapy in order to eliminate sleep apnea.   ROS  General: (-) fever, (-) chills, (-) night sweat Nose and Sinuses: (-) nasal stuffiness or itchiness, (-) postnasal drip, (-) nosebleeds, (-) sinus trouble. Mouth and Throat: (-) sore throat, (-) hoarseness. Neck: (-) swollen glands, (-) enlarged thyroid, (-) neck pain. Respiratory: *** cough, *** shortness of breath, *** wheezing. Neurologic: *** numbness, *** tingling. Psychiatric: *** anxiety, *** depression   Current Medication: Outpatient Encounter Medications as of 09/05/2022  Medication Sig   albuterol (VENTOLIN HFA) 108 (90 Base) MCG/ACT inhaler Inhale 2 puffs into the lungs every 4 (four) hours as needed for wheezing or shortness of breath.   Azelastine HCl 137 MCG/SPRAY SOLN Place 2 sprays into both nostrils 2 (two) times daily.   brompheniramine-pseudoephedrine-DM 30-2-10 MG/5ML syrup Take 10 mLs by mouth 4 (four) times daily as needed.   cetirizine (ZYRTEC) 10  MG tablet Take 10 mg by mouth daily.   Cetirizine HCl 10 MG CAPS Take 1 capsule (10 mg total) by mouth once. (Patient taking differently: Take 10 mg by mouth daily as needed. )   clonazePAM (KLONOPIN) 0.5 MG disintegrating tablet Take 0.5 mg by mouth 3 (three) times daily as needed. For anxiety   cyclobenzaprine (FLEXERIL) 5 MG tablet Take 1 tablet (5 mg total) by mouth 3 (three) times daily as needed for muscle spasms.   diltiazem (CARDIZEM CD) 240 MG 24 hr capsule Take 240 mg by mouth daily.   diphenhydrAMINE (BENADRYL) 25 mg capsule Take 1 capsule (25 mg total) by mouth every 6 (six) hours as needed.   EPIPEN 2-PAK 0.3 MG/0.3ML SOAJ injection as needed.   fluticasone (FLONASE) 50 MCG/ACT nasal spray Place 1 spray into both nostrils 2 (two) times daily.   Magnesium 500 MG CAPS Take by mouth every morning.   metFORMIN (GLUCOPHAGE-XR) 500 MG 24 hr tablet Take by mouth.   metoCLOPramide (REGLAN) 10 MG tablet Take 1 tablet (10 mg total) by mouth every 6 (six) hours as needed.   Norethindrone Acetate-Ethinyl Estrad-FE (LOESTRIN 24 FE) 1-20 MG-MCG(24) tablet Take 1 tablet by mouth daily.   ondansetron (ZOFRAN ODT) 4 MG disintegrating tablet Take 1 tablet (4 mg total) by mouth every 8 (eight) hours as needed for nausea or vomiting.   riboflavin (VITAMIN B-2) 100 MG TABS tablet Take 100 mg by mouth every morning. 2 tablets   topiramate (TOPAMAX) 25 MG tablet Take 25 mg by mouth at bedtime.   traMADol (ULTRAM) 50 MG tablet Take 50 mg by  mouth every 6 (six) hours as needed.   No facility-administered encounter medications on file as of 09/05/2022.    Surgical History: Past Surgical History:  Procedure Laterality Date   TONSILLECTOMY     WISDOM TOOTH EXTRACTION  2009   4 teeth    Medical History: Past Medical History:  Diagnosis Date   Anxiety    Anxiety    Depression    Migraines    Multiple gastric ulcers    Pelvic pain     Family History: Non contributory to the present  illness  Social History: Social History   Socioeconomic History   Marital status: Single    Spouse name: Not on file   Number of children: Not on file   Years of education: Not on file   Highest education level: Not on file  Occupational History   Not on file  Tobacco Use   Smoking status: Never   Smokeless tobacco: Never  Substance and Sexual Activity   Alcohol use: No    Comment: 1 drink per year   Drug use: No   Sexual activity: Not on file  Other Topics Concern   Not on file  Social History Narrative   Not on file   Social Determinants of Health   Financial Resource Strain: Medium Risk (12/14/2020)   Received from [facility]   Overall Financial Resource Strain (CARDIA)    Difficulty of Paying Living Expenses: Somewhat hard  Food Insecurity: No Food Insecurity (12/14/2020)   Received from [facility]   Hunger Vital Sign    Worried About Running Out of Food in the Last Year: Never true    Ran Out of Food in the Last Year: Never true  Transportation Needs: No Transportation Needs (12/14/2020)   Received from [facility]   PRAPARE - Transportation    Lack of Transportation (Medical): No    Lack of Transportation (Non-Medical): No  Physical Activity: Inactive (05/21/2020)   [facility] from [facility]   Exercise Vital Sign    Days of Exercise per Week: 0 days    Minutes of Exercise per Session: 0 min  Stress: Not on file  Social Connections: Not on file  Intimate Partner Violence: Not on file    Vital Signs: There were no vitals taken for this visit. There is no height or weight on file to calculate BMI.    Examination: General Appearance: The patient is well-developed, well-nourished, and in no distress. Neck Circumference: 42cm Skin: Gross inspection of skin unremarkable. Head: normocephalic, no gross deformities. Eyes: no gross deformities noted. ENT: ears appear grossly normal Neurologic: Alert and oriented. No involuntary  movements.  STOP BANG RISK ASSESSMENT S (snore) Have you been told that you snore?     No   T (tired) Are you often tired, fatigued, or sleepy during the day?   NO  O (obstruction) Do you stop breathing, choke, or gasp during sleep? NO   P (pressure) Do you have or are you being treated for high blood pressure? NO   B (BMI) Is your body index greater than 35 kg/m? YES   A (age) Are you 70 years old or older? NO   N (neck) Do you have a neck circumference greater than 16 inches?   YES   G (gender) Are you a female? NO   TOTAL STOP/BANG "YES" ANSWERS 2       A STOP-Bang score of 2 or less is considered low risk, and a  score of 5 or more is high risk for having either moderate or severe OSA. For people who score 3 or 4, doctors may need to perform further assessment to determine how likely they are to have OSA.         EPWORTH SLEEPINESS SCALE:  Scale:  (0)= no chance of dozing; (1)= slight chance of dozing; (2)= moderate chance of dozing; (3)= high chance of dozing  Chance  Situtation    Sitting and reading: 1    Watching TV: 2    Sitting Inactive in public: 0    As a passenger in car: 0      Lying down to rest: 2    Sitting and talking: 0    Sitting quielty after lunch: 0    In a car, stopped in traffic: 0   TOTAL SCORE:   5 out of 24    SLEEP STUDIES: PSG 03/12/21 AHI 8.8 nadir oxygen 86% 03/31/21 CPAP titration 14 cm    CPAP COMPLIANCE DATA:  Date Range: 08/31/2021-08/30/2022  Average Daily Use: 7 hours 25 minutes  Median Use: 7 hours 29 minutes  Compliance for > 4 Hours: 99%  AHI: 0.9 respiratory events per hour  Days Used: 364/365 days  Mask Leak: 12.3  95th Percentile Pressure: 13         LABS: No results found for this or any previous visit (from the past 2160 hour(s)).  Radiology: No results found.  No results found.  No results found.    Assessment and Plan: Patient Active Problem List   Diagnosis Date Noted   OSA on  CPAP 09/06/2021   CPAP use counseling 09/06/2021   Morbid obesity (HCC) 09/06/2021   OSA (obstructive sleep apnea) 03/17/2021   Paroxysmal atrial fibrillation (HCC) 01/28/2021   GAD (generalized anxiety disorder) 12/14/2020   MDD (major depressive disorder), recurrent episode, moderate (HCC) 12/14/2020    1. OSA on CPAP The patient does tolerate PAP and reports  benefit from PAP use. The patient was reminded how to clean equipment and advised to replace supplies routinely. The patient was also counselled on weight loss. The compliance is excellent. The AHI is 0.9   OSA on cpap- controlled. Continue with excellent compliance with pap. CPAP continues to be medically necessary to treat this patient's OSA. F/u one year.     2. CPAP use counseling CPAP Counseling: had a lengthy discussion with the patient regarding the importance of PAP therapy in management of the sleep apnea. Patient appears to understand the risk factor reduction and also understands the risks associated with untreated sleep apnea. Patient will try to make a good faith effort to remain compliant with therapy. Also instructed the patient on proper cleaning of the device including the water must be changed daily if possible and use of distilled water is preferred. Patient understands that the machine should be regularly cleaned with appropriate recommended cleaning solutions that do not damage the PAP machine for example given white vinegar and water rinses. Other methods such as ozone treatment may not be as good as these simple methods to achieve cleaning.   3. Morbid obesity (HCC) Obesity Counseling: Had a lengthy discussion regarding patients BMI and weight issues. Patient was instructed on portion control as well as increased activity. Also discussed caloric restrictions with trying to maintain intake less than 2000 Kcal. Discussions were made in accordance with the 5As of weight management. Simple actions such as not eating  late and if able to, taking a walk  is suggested.   4. Paroxysmal atrial fibrillation (HCC) Understands importance of compliance with cpap in light of same.     General Counseling: I have discussed the findings of the evaluation and examination with Anvika.  I have also discussed any further diagnostic evaluation thatmay be needed or ordered today. Bernadean verbalizes understanding of the findings of todays visit. We also reviewed her medications today and discussed drug interactions and side effects including but not limited excessive drowsiness and altered mental states. We also discussed that there is always a risk not just to her but also people around her. she has been encouraged to call the office with any questions or concerns that should arise related to todays visit.  No orders of the defined types were placed in this encounter.       I have personally obtained a history, examined the patient, evaluated laboratory and imaging results, formulated the assessment and plan and placed orders. This patient was seen today by Emmaline Kluver, PA-C in collaboration with Dr. Freda Munro.   Yevonne Pax, MD [facility] Diplomate ABMS Pulmonary Critical Care Medicine and Sleep Medicine

## 2022-09-05 ENCOUNTER — Ambulatory Visit (INDEPENDENT_AMBULATORY_CARE_PROVIDER_SITE_OTHER): Payer: Medicaid Other | Admitting: Internal Medicine

## 2022-09-05 VITALS — BP 118/76 | HR 76 | Resp 12 | Ht 64.0 in | Wt 246.3 lb

## 2022-09-05 DIAGNOSIS — G4733 Obstructive sleep apnea (adult) (pediatric): Secondary | ICD-10-CM

## 2022-09-05 DIAGNOSIS — Z7189 Other specified counseling: Secondary | ICD-10-CM

## 2022-09-05 DIAGNOSIS — I480 Paroxysmal atrial fibrillation: Secondary | ICD-10-CM | POA: Diagnosis not present

## 2022-09-05 NOTE — Patient Instructions (Signed)

## 2023-04-13 ENCOUNTER — Ambulatory Visit: Payer: Medicaid Other | Admitting: Physical Therapy

## 2023-06-14 ENCOUNTER — Ambulatory Visit: Attending: Family | Admitting: Physical Therapy

## 2023-09-08 NOTE — Progress Notes (Signed)
 [facility] [address] [place], [place] [address]  Pulmonary Sleep Medicine   Office Visit Note  Patient Name: Michelle Rowland DOB: February 17, 1985 MRN [id number]    Chief Complaint: Obstructive Sleep Apnea visit  Brief History:  Michelle Rowland is seen today for an annual follow up visit for CPAP@ 13 cmH2O. The patient has a 3 year history of sleep apnea. Patient is using PAP nightly.  The patient feels rested after sleeping with PAP.  The patient reports benefiting from PAP use. Reported sleepiness is  improved and the Epworth Sleepiness Score is 3 out of 24. The patient does not take naps. The patient complains of the following: pt is in need of a water chamber.  The compliance download shows 99% compliance with an average use time of 7 hours 34 minutes. The AHI is 1.2.  The patient does not complain of limb movements disrupting sleep. The patient continues to require PAP therapy in order to eliminate sleep apnea.   ROS  General: (-) fever, (-) chills, (-) night sweat Nose and Sinuses: (-) nasal stuffiness or itchiness, (-) postnasal drip, (-) nosebleeds, (-) sinus trouble. Mouth and Throat: (-) sore throat, (-) hoarseness. Neck: (-) swollen glands, (-) enlarged thyroid, (-) neck pain. Respiratory: - cough, - shortness of breath, - wheezing. Neurologic: - numbness, - tingling. Psychiatric: - anxiety, - depression   Current Medication: Outpatient Encounter Medications as of 09/11/2023  Medication Sig   apixaban (ELIQUIS) 5 MG TABS tablet Take 5 mg by mouth 2 (two) times daily.   furosemide (LASIX) 20 MG tablet Take 20 mg by mouth.   Rimegepant Sulfate 75 MG TBDP Take at onset of migraine. DO NOT exceed 1 doses in 24 hours, no more than 2x per week.   Vitamin D, Ergocalciferol, (DRISDOL) 1.25 MG (50000 UNIT) CAPS capsule TAKE 1 CAPSULE(1250 MCG) BY MOUTH 1 TIME A WEEK FOR 12 DOSES   WEGOVY 1.7 MG/0.75ML SOAJ SQ injection Inject 1.7 mg into the skin.   albuterol  (VENTOLIN  HFA)  108 (90 Base) MCG/ACT inhaler Inhale 2 puffs into the lungs every 4 (four) hours as needed for wheezing or shortness of breath.   Azelastine HCl 137 MCG/SPRAY SOLN Place 2 sprays into both nostrils 2 (two) times daily.   cetirizine  (ZYRTEC ) 10 MG tablet Take 10 mg by mouth daily.   diltiazem (CARDIZEM CD) 120 MG 24 hr capsule Take by mouth.   diltiazem (CARDIZEM CD) 240 MG 24 hr capsule Take 240 mg by mouth daily.   diphenhydrAMINE  (BENADRYL ) 25 mg capsule Take 1 capsule (25 mg total) by mouth every 6 (six) hours as needed.   EPIPEN  2-PAK 0.3 MG/0.3ML SOAJ injection as needed.   fluticasone  (FLONASE ) 50 MCG/ACT nasal spray Place 1 spray into both nostrils 2 (two) times daily.   Magnesium 500 MG CAPS Take by mouth every morning.   metFORMIN (GLUCOPHAGE-XR) 500 MG 24 hr tablet Take by mouth.   ondansetron  (ZOFRAN  ODT) 4 MG disintegrating tablet Take 1 tablet (4 mg total) by mouth every 8 (eight) hours as needed for nausea or vomiting.   riboflavin (VITAMIN B-2) 100 MG TABS tablet Take 100 mg by mouth every morning. 2 tablets   Semaglutide-Weight Management (WEGOVY) 0.25 MG/0.5ML SOAJ Inject into the skin.   No facility-administered encounter medications on file as of 09/11/2023.    Surgical History: Past Surgical History:  Procedure Laterality Date   CARDIAC ELECTROPHYSIOLOGY STUDY AND ABLATION     TONSILLECTOMY     WISDOM TOOTH EXTRACTION  2009   4 teeth    Medical History: Past Medical History:  Diagnosis Date   Anxiety    Anxiety    Depression    Migraines    Multiple gastric ulcers    Pelvic pain     Family History: Non contributory to the present illness  Social History: Social History   Socioeconomic History   Marital status: Single    Spouse name: Not on file   Number of children: Not on file   Years of education: Not on file   Highest education level: Not on file  Occupational History   Not on file  Tobacco Use   Smoking status: Never   Smokeless tobacco:  Never  Substance and Sexual Activity   Alcohol use: No    Comment: 1 drink per year   Drug use: No   Sexual activity: Not on file  Other Topics Concern   Not on file  Social History Narrative   Not on file   Social Drivers of Health   Financial Resource Strain: Low Risk  (08/24/2022)   Received from [facility]   Overall Financial Resource Strain (CARDIA)    Difficulty of Paying Living Expenses: Not hard at all  Food Insecurity: No Food Insecurity (08/25/2023)   Received from [facility]   Hunger Vital Sign    Within the past 12 months, you worried that your food would run out before you got the money to buy more.: Never true    Within the past 12 months, the food you bought just didn't last and you didn't have money to get more.: Never true  Transportation Needs: No Transportation Needs (08/25/2023)   Received from [facility]   PRAPARE - Transportation    Lack of Transportation (Medical): No    Lack of Transportation (Non-Medical): No  Physical Activity: Inactive (05/21/2020)   Received from [facility]   Exercise Vital Sign    On average, how many days per week do you engage in moderate to strenuous exercise (like a brisk walk)?: 0 days    On average, how many minutes do you engage in exercise at this level?: 0 min  Stress: Not on file  Social Connections: Not on file  Intimate Partner Violence: Patient Unable To Answer (06/16/2023)   Received from [facility]   Humiliation, Afraid, Rape, and Kick questionnaire    Within the last year, have you been afraid of your partner or ex-partner?: Patient unable to answer    Within the last year, have you been humiliated or emotionally abused in other ways by your partner or ex-partner?: Patient unable to answer    Within the last year, have you been kicked, hit, slapped, or otherwise physically hurt by your partner or ex-partner?: Patient unable to answer    Within the last year, have you been raped or forced to have  any kind of sexual activity by your partner or ex-partner?: Patient unable to answer    Vital Signs: Blood pressure 102/81, pulse 93, resp. rate 16, height 5' 4 (1.626 m), weight 211 lb (95.7 kg), SpO2 97%. Body mass index is 36.22 kg/m.    Examination: General Appearance: The patient is well-developed, well-nourished, and in no distress. Neck Circumference: 38 cm Skin: Gross inspection of skin unremarkable. Head: normocephalic, no gross deformities. Eyes: no gross deformities noted. ENT: ears appear grossly normal Neurologic: Alert and oriented. No involuntary movements.  STOP BANG RISK ASSESSMENT S (snore) Have you been  told that you snore?     NO   T (tired) Are you often tired, fatigued, or sleepy during the day?   NO  O (obstruction) Do you stop breathing, choke, or gasp during sleep? NO   P (pressure) Do you have or are you being treated for high blood pressure? NO   B (BMI) Is your body index greater than 35 kg/m? YES   A (age) Are you 32 years old or older? NO   N (neck) Do you have a neck circumference greater than 16 inches?   NO   G (gender) Are you a female? NO   TOTAL STOP/BANG "YES" ANSWERS 1       A STOP-Bang score of 2 or less is considered low risk, and a score of 5 or more is high risk for having either moderate or severe OSA. For people who score 3 or 4, doctors may need to perform further assessment to determine how likely they are to have OSA.         EPWORTH SLEEPINESS SCALE:  Scale:  (0)= no chance of dozing; (1)= slight chance of dozing; (2)= moderate chance of dozing; (3)= high chance of dozing  Chance  Situtation    Sitting and reading: 1    Watching TV: 1    Sitting Inactive in public: 0    As a passenger in car: 1      Lying down to rest: 0    Sitting and talking: 0    Sitting quielty after lunch: 0    In a car, stopped in traffic: 0   TOTAL SCORE:   3 out of 24    SLEEP STUDIES:  PSG (02/2014) AHI 13.4/Hr, REM AHI  33.8/hr, Supine AHI 42.3/hr, min SpO2 41% Titration (03/2021) CPAP@ 14 cmH2O   CPAP COMPLIANCE DATA:  Date Range: 09/08/2022-09/07/2023  Average Daily Use: 7 hours 34 minutes  Median Use: 7 hours 35 minutes  Compliance for > 4 Hours: 99%  AHI: 1.2 respiratory events per hour  Days Used: 365/365 days  Mask Leak: 10.9  95th Percentile Pressure: 13         LABS: No results found for this or any previous visit (from the past 2160 hours).  Radiology: No results found.  No results found.  No results found.    Assessment and Plan: Patient Active Problem List   Diagnosis Date Noted   OSA on CPAP 09/06/2021   CPAP use counseling 09/06/2021   Morbid obesity (HCC) 09/06/2021   OSA (obstructive sleep apnea) 03/17/2021   Paroxysmal atrial fibrillation (HCC) 01/28/2021   GAD (generalized anxiety disorder) 12/14/2020   MDD (major depressive disorder), recurrent episode, moderate (HCC) 12/14/2020   1. OSA on CPAP (Primary) The patient does tolerate PAP and reports  benefit from PAP use. The patient was reminded how to clean equipment and advised to replace supplies routinely. The patient was also counselled on weight loss. The compliance is excellent. The AHI is 1.2.   OSA on cpap- controlled. Continue with excellent compliance with pap. CPAP continues to be medically necessary to treat this patient's OSA. F/u one year.    2. CPAP use counseling CPAP Counseling: had a lengthy discussion with the patient regarding the importance of PAP therapy in management of the sleep apnea. Patient appears to understand the risk factor reduction and also understands the risks associated with untreated sleep apnea. Patient will try to make a good faith effort to remain compliant with therapy. Also instructed the  patient on proper cleaning of the device including the water must be changed daily if possible and use of distilled water is preferred. Patient understands that the machine should  be regularly cleaned with appropriate recommended cleaning solutions that do not damage the PAP machine for example given white vinegar and water rinses. Other methods such as ozone treatment may not be as good as these simple methods to achieve cleaning.   3. Paroxysmal atrial fibrillation [facility]) Patient had a recurrence of afib and had an ablation done recently. She has done well since. Continue f/u per cardiology.      General Counseling: I have discussed the findings of the evaluation and examination with Michelle Rowland.  I have also discussed any further diagnostic evaluation thatmay be needed or ordered today. Lari verbalizes understanding of the findings of todays visit. We also reviewed her medications today and discussed drug interactions and side effects including but not limited excessive drowsiness and altered mental states. We also discussed that there is always a risk not just to her but also people around her. she has been encouraged to call the office with any questions or concerns that should arise related to todays visit.  No orders of the defined types were placed in this encounter.       I have personally obtained a history, examined the patient, evaluated laboratory and imaging results, formulated the assessment and plan and placed orders. This patient was seen today by Michelle Lay, PA-C in collaboration with Dr. Elfreda Bathe.   Elfreda Michelle Rowland Bathe, MD [facility] Diplomate ABMS Pulmonary Critical Care Medicine and Sleep Medicine

## 2023-09-11 ENCOUNTER — Ambulatory Visit (INDEPENDENT_AMBULATORY_CARE_PROVIDER_SITE_OTHER): Admitting: Internal Medicine

## 2023-09-11 VITALS — BP 102/81 | HR 93 | Resp 16 | Ht 64.0 in | Wt 211.0 lb

## 2023-09-11 DIAGNOSIS — I480 Paroxysmal atrial fibrillation: Secondary | ICD-10-CM | POA: Diagnosis not present

## 2023-09-11 DIAGNOSIS — G4733 Obstructive sleep apnea (adult) (pediatric): Secondary | ICD-10-CM

## 2023-09-11 DIAGNOSIS — Z7189 Other specified counseling: Secondary | ICD-10-CM | POA: Diagnosis not present

## 2023-09-11 NOTE — Patient Instructions (Signed)
